# Patient Record
Sex: Male | Born: 1945 | Race: Black or African American | Hispanic: No | Marital: Married | Smoking: Former smoker
Health system: Southern US, Community
[De-identification: ages and names within clinical notes are randomized; demographics above are authoritative.]

## PROBLEM LIST (undated history)

## (undated) DIAGNOSIS — R079 Chest pain, unspecified: Secondary | ICD-10-CM

## (undated) DIAGNOSIS — M51369 Other intervertebral disc degeneration, lumbar region without mention of lumbar back pain or lower extremity pain: Secondary | ICD-10-CM

## (undated) DIAGNOSIS — J309 Allergic rhinitis, unspecified: Secondary | ICD-10-CM

## (undated) DIAGNOSIS — D696 Thrombocytopenia, unspecified: Secondary | ICD-10-CM

## (undated) DIAGNOSIS — R768 Other specified abnormal immunological findings in serum: Secondary | ICD-10-CM

## (undated) DIAGNOSIS — D72819 Decreased white blood cell count, unspecified: Secondary | ICD-10-CM

## (undated) DIAGNOSIS — H9313 Tinnitus, bilateral: Secondary | ICD-10-CM

## (undated) DIAGNOSIS — Z889 Allergy status to unspecified drugs, medicaments and biological substances status: Secondary | ICD-10-CM

## (undated) DIAGNOSIS — B1910 Unspecified viral hepatitis B without hepatic coma: Secondary | ICD-10-CM

## (undated) DIAGNOSIS — G43909 Migraine, unspecified, not intractable, without status migrainosus: Secondary | ICD-10-CM

## (undated) DIAGNOSIS — R42 Dizziness and giddiness: Secondary | ICD-10-CM

## (undated) DIAGNOSIS — E7800 Pure hypercholesterolemia, unspecified: Secondary | ICD-10-CM

## (undated) DIAGNOSIS — H8110 Benign paroxysmal vertigo, unspecified ear: Secondary | ICD-10-CM

## (undated) DIAGNOSIS — R7303 Prediabetes: Secondary | ICD-10-CM

## (undated) DIAGNOSIS — N400 Enlarged prostate without lower urinary tract symptoms: Secondary | ICD-10-CM

## (undated) DIAGNOSIS — I10 Essential (primary) hypertension: Secondary | ICD-10-CM

## (undated) DIAGNOSIS — N529 Male erectile dysfunction, unspecified: Secondary | ICD-10-CM

## (undated) DIAGNOSIS — M1990 Unspecified osteoarthritis, unspecified site: Secondary | ICD-10-CM

## (undated) HISTORY — DX: Prediabetes: R73.03

## (undated) HISTORY — DX: Chest pain, unspecified: R07.9

## (undated) HISTORY — DX: Pure hypercholesterolemia, unspecified: E78.00

## (undated) HISTORY — DX: Essential (primary) hypertension: I10

## (undated) HISTORY — DX: Decreased white blood cell count, unspecified: D72.819

## (undated) HISTORY — DX: Tinnitus, bilateral: H93.13

## (undated) HISTORY — DX: Male erectile dysfunction, unspecified: N52.9

## (undated) HISTORY — DX: Thrombocytopenia, unspecified: D69.6

## (undated) HISTORY — DX: Migraine, unspecified, not intractable, without status migrainosus: G43.909

## (undated) HISTORY — DX: Benign paroxysmal vertigo, unspecified ear: H81.10

## (undated) HISTORY — DX: Allergic rhinitis, unspecified: J30.9

## (undated) HISTORY — DX: Unspecified viral hepatitis B without hepatic coma: B19.10

## (undated) HISTORY — DX: Benign prostatic hyperplasia without lower urinary tract symptoms: N40.0

## (undated) HISTORY — DX: Other intervertebral disc degeneration, lumbar region without mention of lumbar back pain or lower extremity pain: M51.369

---

## 1999-12-20 ENCOUNTER — Encounter: Payer: Self-pay | Admitting: Internal Medicine

## 1999-12-20 ENCOUNTER — Encounter: Admission: RE | Admit: 1999-12-20 | Discharge: 1999-12-20 | Payer: Self-pay | Admitting: Internal Medicine

## 2011-05-01 ENCOUNTER — Emergency Department (HOSPITAL_COMMUNITY): Payer: 59

## 2011-05-01 ENCOUNTER — Emergency Department (HOSPITAL_COMMUNITY)
Admission: EM | Admit: 2011-05-01 | Discharge: 2011-05-01 | Disposition: A | Discharge status: Home / Self Care | Payer: 59 | Attending: Emergency Medicine | Admitting: Emergency Medicine

## 2011-05-01 DIAGNOSIS — R079 Chest pain, unspecified: Secondary | ICD-10-CM | POA: Insufficient documentation

## 2011-05-01 DIAGNOSIS — Z87891 Personal history of nicotine dependence: Secondary | ICD-10-CM | POA: Insufficient documentation

## 2011-05-01 DIAGNOSIS — Z79899 Other long term (current) drug therapy: Secondary | ICD-10-CM | POA: Insufficient documentation

## 2011-05-01 LAB — CK TOTAL AND CKMB (NOT AT ARMC)
CK, MB: 3.3 ng/mL (ref 0.3–4.0)
CK, MB: 3.1 ng/mL (ref 0.3–4.0)
Relative Index: 1.6 (ref 0.0–2.5)
Relative Index: 1.5 (ref 0.0–2.5)
Total CK: 204 U/L (ref 7–232)
Total CK: 202 U/L (ref 7–232)

## 2011-05-01 LAB — CBC
HCT: 42.8 % (ref 39.0–52.0)
Hemoglobin: 15.1 g/dL (ref 13.0–17.0)
MCH: 30.4 pg (ref 26.0–34.0)
MCHC: 35.3 g/dL (ref 30.0–36.0)
MCV: 86.3 fL (ref 78.0–100.0)
Platelets: 145 10*3/uL — ABNORMAL LOW (ref 150–400)
RBC: 4.96 MIL/uL (ref 4.22–5.81)
RDW: 14.6 % (ref 11.5–15.5)
WBC: 4.9 10*3/uL (ref 4.0–10.5)

## 2011-05-01 LAB — DIFFERENTIAL
Basophils Absolute: 0 10*3/uL (ref 0.0–0.1)
Basophils Relative: 0 % (ref 0–1)
Eosinophils Absolute: 0 10*3/uL (ref 0.0–0.7)
Eosinophils Relative: 1 % (ref 0–5)
Lymphocytes Relative: 26 % (ref 12–46)
Lymphs Abs: 1.3 10*3/uL (ref 0.7–4.0)
Monocytes Absolute: 0.2 10*3/uL (ref 0.1–1.0)
Monocytes Relative: 3 % (ref 3–12)
Neutro Abs: 3.5 10*3/uL (ref 1.7–7.7)
Neutrophils Relative %: 70 % (ref 43–77)

## 2011-05-01 LAB — BASIC METABOLIC PANEL
CO2: 25 mEq/L (ref 19–32)
Calcium: 9.1 mg/dL (ref 8.4–10.5)
Chloride: 104 mEq/L (ref 96–112)
Glucose, Bld: 112 mg/dL — ABNORMAL HIGH (ref 70–99)
Potassium: 4 mEq/L (ref 3.5–5.1)
Sodium: 138 mEq/L (ref 135–145)

## 2011-05-01 LAB — TROPONIN I: Troponin I: <0.3 ng/mL (ref <0.30)

## 2014-08-16 ENCOUNTER — Emergency Department (HOSPITAL_COMMUNITY)
Admission: EM | Admit: 2014-08-16 | Discharge: 2014-08-16 | Disposition: A | Discharge status: Home / Self Care | Payer: BC Managed Care – PPO | Attending: Emergency Medicine | Admitting: Emergency Medicine

## 2014-08-16 ENCOUNTER — Encounter (HOSPITAL_COMMUNITY): Payer: Self-pay

## 2014-08-16 DIAGNOSIS — I10 Essential (primary) hypertension: Secondary | ICD-10-CM | POA: Diagnosis not present

## 2014-08-16 DIAGNOSIS — R42 Dizziness and giddiness: Secondary | ICD-10-CM | POA: Diagnosis present

## 2014-08-16 DIAGNOSIS — Z87891 Personal history of nicotine dependence: Secondary | ICD-10-CM | POA: Diagnosis not present

## 2014-08-16 DIAGNOSIS — H81399 Other peripheral vertigo, unspecified ear: Secondary | ICD-10-CM

## 2014-08-16 MED ORDER — MECLIZINE HCL 25 MG PO TABS
25.0000 mg | ORAL_TABLET | Freq: Two times a day (BID) | ORAL | Status: DC | PRN
  Administered 2014-08-16: 25 mg via ORAL

## 2014-08-16 MED ORDER — MECLIZINE HCL 25 MG PO TABS: 25.0000 mg | ORAL_TABLET | Freq: Two times a day (BID) | ORAL | Status: DC | PRN

## 2014-08-16 MED ORDER — MECLIZINE HCL 25 MG PO TABS
12.5000 mg | ORAL_TABLET | Freq: Two times a day (BID) | ORAL | Status: DC | PRN
  Filled 2014-08-16: qty 1

## 2014-08-16 NOTE — ED Provider Notes (Signed)
CSN: [id number]     Arrival date & time 08/16/14  0827 History   First MD Initiated Contact with Patient 08/16/14 [phone]     Chief Complaint  Patient presents with  . Dizziness    HPI [name] is a 68yo man with no significant PMHx who presents to the ED with vertigo. He states he woke up and turned to the side to turn off the alarm clock when he felt like the world was spinning. He states he had a similar sensation about 1 year ago for which he attributed his symptoms to a cold. He states he took decongestants at that time and the symptoms resolved. He states he took some decongestants this morning, which did not relieve his symptoms. He reports some nausea and pressure behind his eyes and nose, but denies vomiting, fever, chills, poor appetite, headache, difficulty walking, diplopia, chest pain, SOB, and weakness.   History reviewed. No pertinent past medical history. History reviewed. No pertinent past surgical history. History reviewed. No pertinent family history. History  Substance Use Topics  . Smoking status: Former Smoker    Quit date: 10/15/1984  . Smokeless tobacco: Not on file  . Alcohol Use: 4.2 oz/week    7 Cans of beer per week     Comment: Socially    Review of Systems General: Denies night sweats, changes in weight HEENT: Denies ear pain, rhinorrhea, sore throat CV: Denies palpitations, orthopnea Pulm: Denies cough, wheezing GI: Denies diarrhea, constipation, melena, hematochezia GU: Denies dysuria, hematuria, frequency Msk: Denies muscle cramps, joint pains Neuro: Denies numbness, tingling Skin: Denies rashes, bruising   Allergies  Review of patient's allergies indicates no known allergies.  Home Medications   Prior to Admission medications   Not on File   BP 164/135 mmHg  Pulse 66  Temp(Src) 97.6 F (36.4 C) (Oral)  Resp 14  Ht 5\' 9"  (1.753 m)  Wt 196 lb (88.905 kg)  BMI 28.93 kg/m2  SpO2 97% Physical Exam General: alert, sitting up in bed,  NAD HEENT: [place]/AT, EOMI, PERRL, sclera anicteric, mucus membranes moist Neck: supple, no JVD, no lymphadenopathy CV: RRR, normal S1/S2, no m/g/r Pulm: CTA bilaterally, breaths non-labored Abd: BS+, soft, non-distended, non-tender Ext: warm, no edema, moves all Neuro: alert and oriented x 3, facial symmetry, CNs II-XII intact, strength 5/5 in upper and lower extremities bilaterally, finger to nose intact, gait normal  ED Course  Procedures (including critical care time) Labs Review Labs Reviewed - No data to display  Imaging Review No results found.   EKG Interpretation   Date/Time:  Monday August 16 2014 09:33:29 EST Ventricular Rate:  65 PR Interval:  151 QRS Duration: 101 QT Interval:  414 QTC Calculation: 430 R Axis:   -49 Text Interpretation:  Sinus rhythm Left anterior fascicular block Low  voltage, precordial leads Probable left ventricular hypertrophy  Nonspecific T abnormalities, inferior leads changes noted when compared to  prior Confirmed by Christy Gentles  MD, Hart[place] ([id number]) on 08/16/2014 9:42:33 AM      MDM   Final diagnoses:  None    [name] is a 68yo man w/ no significant PMHx who presented with vertigo likely 2/2 peripheral causes, likely benign paroxysmal positional vertigo. Patient did not have any focal deficits on neurological exam (no weakness, finger to nose intact, gait normal) making posterior stroke unlikely. Patient also noted to have elevated BP of 170/115. He likely has hypertension that is undiagnosed. Patient was given Meclizine 25 mg in the ED and reported  that his symptoms improved. He was prescribed Meclizine 25 mg BID PRN dizziness/nausea upon discharge. He was instructed to come back to the ED if he develops sudden onset headache, diplopia, or sudden weakness. He was recommended to follow up with his PCP in 1 week to assess his vertigo as well as his elevated BP.     Albin Felling, MD 08/16/14 1029

## 2014-08-16 NOTE — ED Provider Notes (Signed)
Patient seen/examined in the Emergency Department in conjunction with Resident Physician Provider Rivet Patient reports vertigo, worse with position change Exam : awake/alert, no facial droop, no ptosis, no motor weakness, no past pointing, no ataxia, pt is smiling and well appearing Plan: will give antivert and reassess.  I have low suspicion for occult CVA at this time   Sharyon Cable, MD 08/16/14 [phone]

## 2014-08-16 NOTE — Discharge Instructions (Signed)
Please follow up with your primary care doctor in 1 week. You were in the emergency department for peripheral vertigo and elevated blood pressure.   Take Meclizine (Antivert) 25 mg twice a day as needed for dizziness/nausea. If you develop severe headache, double vision, or sudden weakness please come back to the emergency department.

## 2014-08-16 NOTE — ED Notes (Signed)
Pt reports he woke up this morning and when he stood up he felt like the room was spinning. Pt denies falling. Pt denies headache but reports some pressure behind his eyes. A&O x4.

## 2014-12-15 ENCOUNTER — Other Ambulatory Visit: Payer: Self-pay | Admitting: Internal Medicine

## 2014-12-15 DIAGNOSIS — E780 Pure hypercholesterolemia: Secondary | ICD-10-CM | POA: Diagnosis not present

## 2014-12-15 DIAGNOSIS — N529 Male erectile dysfunction, unspecified: Secondary | ICD-10-CM | POA: Diagnosis not present

## 2014-12-15 DIAGNOSIS — Z0000 Encounter for general adult medical examination without abnormal findings: Secondary | ICD-10-CM | POA: Diagnosis not present

## 2014-12-15 DIAGNOSIS — Z125 Encounter for screening for malignant neoplasm of prostate: Secondary | ICD-10-CM | POA: Diagnosis not present

## 2014-12-15 DIAGNOSIS — Z139 Encounter for screening, unspecified: Secondary | ICD-10-CM

## 2014-12-22 ENCOUNTER — Ambulatory Visit
Admission: RE | Admit: 2014-12-22 | Discharge: 2014-12-22 | Disposition: A | Discharge status: Home / Self Care | Payer: Medicare Other | Source: Ambulatory Visit | Attending: Internal Medicine | Admitting: Internal Medicine

## 2014-12-22 DIAGNOSIS — Z139 Encounter for screening, unspecified: Secondary | ICD-10-CM

## 2014-12-22 DIAGNOSIS — Z136 Encounter for screening for cardiovascular disorders: Secondary | ICD-10-CM | POA: Diagnosis not present

## 2014-12-22 DIAGNOSIS — Z87891 Personal history of nicotine dependence: Secondary | ICD-10-CM | POA: Diagnosis not present

## 2015-01-24 DIAGNOSIS — H2513 Age-related nuclear cataract, bilateral: Secondary | ICD-10-CM | POA: Diagnosis not present

## 2015-01-24 DIAGNOSIS — H43812 Vitreous degeneration, left eye: Secondary | ICD-10-CM | POA: Diagnosis not present

## 2015-06-17 ENCOUNTER — Encounter (HOSPITAL_COMMUNITY): Payer: Self-pay | Admitting: *Deleted

## 2015-06-17 ENCOUNTER — Emergency Department (HOSPITAL_COMMUNITY)
Admission: EM | Admit: 2015-06-17 | Discharge: 2015-06-17 | Disposition: A | Discharge status: Home / Self Care | Payer: Worker's Compensation | Attending: Emergency Medicine | Admitting: Emergency Medicine

## 2015-06-17 DIAGNOSIS — Z87891 Personal history of nicotine dependence: Secondary | ICD-10-CM | POA: Insufficient documentation

## 2015-06-17 DIAGNOSIS — Y998 Other external cause status: Secondary | ICD-10-CM | POA: Diagnosis not present

## 2015-06-17 DIAGNOSIS — Y9389 Activity, other specified: Secondary | ICD-10-CM | POA: Insufficient documentation

## 2015-06-17 DIAGNOSIS — S60451A Superficial foreign body of left index finger, initial encounter: Secondary | ICD-10-CM | POA: Diagnosis not present

## 2015-06-17 DIAGNOSIS — S6991XA Unspecified injury of right wrist, hand and finger(s), initial encounter: Secondary | ICD-10-CM | POA: Diagnosis present

## 2015-06-17 DIAGNOSIS — Y9289 Other specified places as the place of occurrence of the external cause: Secondary | ICD-10-CM | POA: Diagnosis not present

## 2015-06-17 DIAGNOSIS — W228XXA Striking against or struck by other objects, initial encounter: Secondary | ICD-10-CM | POA: Insufficient documentation

## 2015-06-17 DIAGNOSIS — Z189 Retained foreign body fragments, unspecified material: Secondary | ICD-10-CM

## 2015-06-17 MED ORDER — LIDOCAINE HCL (PF) 1 % IJ SOLN
10.0000 mL | Freq: Once | INTRAMUSCULAR | Status: DC
  Filled 2015-06-17: qty 10

## 2015-06-17 NOTE — ED Notes (Signed)
Hand surgeon at bedside.  Lidocaine provided to MD.  Suture cart and supplies provided at bedside

## 2015-06-17 NOTE — ED Provider Notes (Signed)
CSN: [id number]     Arrival date & time 06/17/15  1536 History  This chart was scribed for non-physician practitioner, Carlisle Cater, PA-C working with Milton Ferguson, MD by Tula Nakayama, ED scribe. This patient was seen in room TR02C/TR02C and the patient's care was started at 4:17 PM    Chief Complaint  Patient presents with  . Hand Injury   The history is provided by the patient. No language interpreter was used.   HPI Comments: Charles Reed is a 69 y.o. male who presents to the Emergency Department complaining of constant, moderate left hand pain secondary to a foreign body in his index finger that started 2 days ago. He states swelling of his left hand as an associated symptom. His pain becomes worse with extending his fingers. 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 He was evaluated by urgent care after the injury, had x-rays of the affected area and was referred to a specialist for removal. Pt was prescribed Keflex and Vicodin, but has not taken the pain medication because of its potency. His Tetanus is UTD. Pt denies fever, nausea and vomiting as associated symptoms.   History reviewed. No pertinent past medical history. History reviewed. No pertinent past surgical history. History reviewed. No pertinent family history. Social History  Substance Use Topics  . Smoking status: Former Smoker    Quit date: 10/15/1984  . Smokeless tobacco: None  . Alcohol Use: 4.2 oz/week    7 Cans of beer per week     Comment: Socially    Review of Systems  Constitutional: Negative for fever, chills and activity change.  Gastrointestinal: Negative for vomiting.  Musculoskeletal: Positive for joint swelling and arthralgias. Negative for back pain, gait problem and neck pain.  Skin: Positive for wound.  Neurological: Negative for weakness and numbness.    Allergies  Review of patient's allergies indicates no known allergies.  Home  Medications   Prior to Admission medications   Medication Sig Start Date End Date Taking? Authorizing Provider  ibuprofen (ADVIL,MOTRIN) 200 MG tablet Take 400 mg by mouth every 6 (six) hours as needed (for pain).    Historical Provider, MD  meclizine (ANTIVERT) 25 MG tablet Take 1 tablet (25 mg total) by mouth 2 (two) times daily as needed for dizziness. 08/16/14   Carly Montey Hora, MD  pseudoephedrine-acetaminophen (TYLENOL SINUS) 30-500 MG TABS Take 2 tablets by mouth every 4 (four) hours as needed (for congestion).    Historical Provider, MD   BP 148/96 mmHg  Pulse 76  Temp(Src) 97.8 F (36.6 C) (Oral)  Resp 16  SpO2 100%   Physical Exam  Constitutional: He appears well-developed and well-nourished.  HENT:  Head: Normocephalic and atraumatic.  Eyes: Conjunctivae are normal.  Neck: Normal range of motion. Neck supple.  Pulmonary/Chest: No respiratory distress.  Neurological: He is alert.  Skin: Skin is warm and dry.  There is approximately 3 cm area of erythema, swelling, and warmth between the second and third MCP joints of the dorsum of the left hand. No fluctuance. 0/4 [place] signs present. Patient can flex and extend at the MCP with minimal discomfort.  Psychiatric: He has a normal mood and affect.  Nursing note and vitals reviewed.   ED Course  Procedures   DIAGNOSTIC STUDIES: Oxygen Saturation is 100% on RA, normal by my interpretation.    COORDINATION OF CARE: 4:26 PM Discussed non-emergent nature of injury and hesitance to remove  metal piece in the ED. Will review x-rays on the CD. Pt agreed to plan.   Labs Review Labs Reviewed - No data to display  Imaging Review No results found.    EKG Interpretation None       Vital signs reviewed and are as follows: Filed Vitals:   06/17/15 1554  BP: 148/96  Pulse: 76  Temp: 97.8 F (36.6 C)  Resp: 16   5:21 PM Patient seen with Dr. Roderic Palau. I have spoken with Dr. Amedeo Plenty who will see patient in ED to advise on  plan.   7:07 PM Dr. Amedeo Plenty evaluated pt. To perform I&D when possible.   After I&D completed, patient was discharged to home. Instructions per Dr. Amedeo Plenty.    MDM   Final diagnoses:  Retained foreign body   Patient with FB, removed in ED by Dr. Amedeo Plenty.   I personally performed the services described in this documentation, which was scribed in my presence. The recorded information has been reviewed and is accurate.    Carlisle Cater, PA-C 06/17/15 2352  Milton Ferguson, MD 06/18/15 [phone]

## 2015-06-17 NOTE — Consult Note (Signed)
Reason for Consult: Left dorsal hand foreign body with pain after a puncture injury Referring Physician: ER staff  Charles Reed is an 69 y.o. male.  HPI: Patient is a pleasant 69 year old male status post puncture injury with retained foreign body pain and discomfort. He's had marked swelling and a problem since the injury. Eyes other issue or injury  History reviewed. No pertinent past medical history.  History reviewed. No pertinent past surgical history.  History reviewed. No pertinent family history.  Social History:  reports that he quit smoking about 30 years ago. He does not have any smokeless tobacco history on file. He reports that he drinks about 4.2 oz of alcohol per week. He reports that he does not use illicit drugs.  Allergies: No Known Allergies  Medications: I have reviewed the patient's current medications.  No results found for this or any previous visit (from the past 48 hour(s)).  No results found.  Review of Systems  Constitutional: Negative.   HENT: Negative.   Eyes: Negative.   Respiratory: Negative.   Cardiovascular: Negative.   Gastrointestinal: Negative.   Genitourinary: Negative.   Skin: Negative.   Psychiatric/Behavioral: Negative.    Blood pressure 148/96, pulse 76, temperature 97.8 F (36.6 C), temperature source Oral, resp. rate 16, SpO2 100 %. Physical Exam retained foreign body with pain left hand about the index finger right hand is normal FDP FDS and extensor function appear to be intact he has exquisite pain and difficulty.  X-rays confirm foreign body.  The patient is alert and oriented in no acute distress. The patient complains of pain in the affected upper extremity.  The patient is noted to have a normal HEENT exam. Lung fields show equal chest expansion and no shortness of breath. Abdomen exam is nontender without distention. Lower extremity examination does not show any fracture dislocation or blood clot symptoms. Pelvis is  stable and the neck and back are stable and nontender.  Assessment/Plan: We are planning surgery for your upper extremity. The risk and benefits of surgery to include risk of bleeding, infection, anesthesia,  damage to normal structures and failure of the surgery to accomplish its intended goals of relieving symptoms and restoring function have been discussed in detail. With this in mind we plan to proceed. I have specifically discussed with the patient the pre-and postoperative regime and the dos and don'ts and risk and benefits in great detail. Risk and benefits of surgery also include risk of dystrophy(CRPS), chronic nerve pain, failure of the healing process to go onto completion and other inherent risks of surgery The relavent the pathophysiology of the disease/injury process, as well as the alternatives for treatment and postoperative course of action has been discussed in great detail with the patient who desires to proceed.  We will do everything in our power to help you (the patient) restore function to the upper extremity. It is a pleasure to see this patient today.  Foreign body noted on x-ray painful left hand  I've can send the patient Friday and repair.  Procedure note patient was taken to the procedure suite underwent a field block followed by sterile prep and drape Betadine scrub and paint followed by incision being made. Incision was made assistance carried down. Irrigation debridement excisional nature skin subtends tissue and peritendinous tissue was accomplished. Following this a, KD perform body was removed in the form of a metallic fragment. This was given to the patient. There was irrigated. The tendon was intact. We closed the length  and placed a sterile dressing. Patient tolerated the procedure well.  He'll continue his Keflex. He will use pain medicine as needed. I'll see him back in the office 9:30 Tuesday morning keep the area clean and dry. These notes been discussed. Keep  bandage clean and dry.  Call for any problems.  No smoking.  Criteria for driving a car: you should be off your pain medicine for 7-8 hours, able to drive one handed(confident), thinking clearly and feeling able in your judgement to drive. Continue elevation as it will decrease swelling.  If instructed by MD move your fingers within the confines of the bandage/splint.  Use ice if instructed by your MD. Call immediately for any sudden loss of feeling in your hand/arm or change in functional abilities of the extremity.We recommend that you to take vitamin C 1000 mg a day to promote healing. We also recommend that if you require  pain medicine that you take a stool softener to prevent constipation as most pain medicines will have constipation side effects. We recommend either Peri-Colace or Senokot and recommend that you also consider adding MiraLAX to prevent the constipation affects from pain medicine if you are required to use them. These medicines are over the counter and maybe purchased at a local pharmacy. A cup of yogurt and a probiotic can also be helpful during the recovery process as the medicines can disrupt your intestinal environment.  Paulene Floor 06/17/2015, 9:48 PM

## 2015-06-17 NOTE — ED Notes (Signed)
Pt stable, ambulatory, states understanding of discharge instructions 

## 2015-06-17 NOTE — ED Notes (Signed)
Pt reports injury to left hand at work on wednesday, reports a piece of metal is in his hand. No acute distress noted.

## 2015-06-17 NOTE — ED Notes (Signed)
Hand surgeon at bedside at this time

## 2015-06-17 NOTE — Discharge Instructions (Signed)
Please read and follow all provided instructions.  Your diagnoses today include:  1. Retained foreign body    Tests performed today include:  Vital signs. See below for your results today.   Medications prescribed:   None  Take any prescribed medications only as directed.   Home care instructions:  Follow any educational materials contained in this packet. Keep affected area above the level of your heart when possible. Wash area gently twice a day with warm soapy water. Do not apply alcohol or hydrogen peroxide. Cover the area if it draining or weeping.   Follow-up instructions: Follow-up with Dr. Amedeo Plenty as discussed.   Return instructions:  Return to the Emergency Department if you have:  Fever  Worsening symptoms  Worsening pain  Worsening swelling  Redness of the skin that moves away from the affected area, especially if it streaks away from the affected area   Any other emergent concerns  Your vital signs today were: BP 148/96 mmHg   Pulse 76   Temp(Src) 97.8 F (36.6 C) (Oral)   Resp 16   SpO2 100% If your blood pressure (BP) was elevated above 135/85 this visit, please have this repeated by your doctor within one month. --------------

## 2015-07-03 ENCOUNTER — Encounter (HOSPITAL_COMMUNITY): Payer: Self-pay | Admitting: Emergency Medicine

## 2015-07-03 ENCOUNTER — Emergency Department (HOSPITAL_COMMUNITY)
Admission: EM | Admit: 2015-07-03 | Discharge: 2015-07-04 | Disposition: A | Discharge status: Home / Self Care | Payer: Medicare Other | Attending: Emergency Medicine | Admitting: Emergency Medicine

## 2015-07-03 DIAGNOSIS — M533 Sacrococcygeal disorders, not elsewhere classified: Secondary | ICD-10-CM | POA: Diagnosis not present

## 2015-07-03 DIAGNOSIS — Z87891 Personal history of nicotine dependence: Secondary | ICD-10-CM | POA: Diagnosis not present

## 2015-07-03 DIAGNOSIS — M899 Disorder of bone, unspecified: Secondary | ICD-10-CM | POA: Diagnosis not present

## 2015-07-03 DIAGNOSIS — M545 Low back pain, unspecified: Secondary | ICD-10-CM

## 2015-07-03 DIAGNOSIS — M898X9 Other specified disorders of bone, unspecified site: Secondary | ICD-10-CM

## 2015-07-03 NOTE — ED Notes (Signed)
Pt states that he has had worsening R hip pain over the past week. Denies injury. Alert and oriented.

## 2015-07-04 ENCOUNTER — Emergency Department (HOSPITAL_COMMUNITY): Payer: Medicare Other

## 2015-07-04 DIAGNOSIS — M5416 Radiculopathy, lumbar region: Secondary | ICD-10-CM | POA: Diagnosis not present

## 2015-07-04 DIAGNOSIS — M533 Sacrococcygeal disorders, not elsewhere classified: Secondary | ICD-10-CM | POA: Diagnosis not present

## 2015-07-04 DIAGNOSIS — M545 Low back pain: Secondary | ICD-10-CM | POA: Diagnosis not present

## 2015-07-04 MED ORDER — HYDROCODONE-ACETAMINOPHEN 5-325 MG PO TABS
1.0000 | ORAL_TABLET | Freq: Once | ORAL | Status: AC
  Administered 2015-07-04: 1 via ORAL
  Filled 2015-07-04: qty 1

## 2015-07-04 MED ORDER — HYDROCODONE-ACETAMINOPHEN 5-325 MG PO TABS: 1.0000 | ORAL_TABLET | Freq: Four times a day (QID) | ORAL | Status: DC | PRN

## 2015-07-04 NOTE — ED Provider Notes (Addendum)
CSN: [id number]   Arrival date & time 07/03/15 2321  History  This chart was scribed for Charles Rosser, MD by Altamease Oiler, ED Scribe. This patient was seen in room WA22/WA22 and the patient's care was started at 12:15 AM.  Chief Complaint  Patient presents with  . Back Pain    HPI The history is provided by the patient. No language interpreter was used.   AMIIR Reed is a 69 y.o. male who presents to the Emergency Department complaining of new and atraumatic pain in the right SI joint with onset 1 week ago. The constant pain is described as sharp and it has been worse over the last 3 days. The pain intermittently radiates down the back of the right leg but he has no pain radiation at present. He rates the pain 7/10 in severity at rest and 10/10 in severity with movement. Associated symptoms include intermittent numbness and weakness in the right leg, no at the present time. Pt denies change in bowel or bladder function and abdominal pain.   History reviewed. No pertinent past medical history.  History reviewed. No pertinent past surgical history.  History reviewed. No pertinent family history.  Social History  Substance Use Topics  . Smoking status: Former Smoker    Quit date: 10/15/1984  . Smokeless tobacco: None  . Alcohol Use: 4.2 oz/week    7 Cans of beer per week     Comment: Socially     Review of Systems 10 Systems reviewed and all are negative for acute change except as noted in the HPI. Home Medications   Prior to Admission medications   Medication Sig Start Date End Date Taking? Authorizing Provider  ibuprofen (ADVIL,MOTRIN) 200 MG tablet Take 400 mg by mouth every 6 (six) hours as needed (for pain).    Historical Provider, MD    Allergies  Review of patient's allergies indicates no known allergies.  Triage Vitals: BP 157/93 mmHg  Pulse 72  Temp(Src) 97.9 F (36.6 C) (Oral)  SpO2 100%  Physical Exam General: Well-developed, well-nourished male in no acute  distress; appearance consistent with age of record HENT: normocephalic; atraumatic Eyes: pupils equal, round and reactive to light; extraocular muscles intact, arcus senilis bilaterally Neck: supple Heart: regular rate and rhythm Lungs: clear to auscultation bilaterally Abdomen: soft; nondistended; nontender; no masses or hepatosplenomegaly; bowel sounds present Back: No tenderness on the back but pain on movement of the lower back; negative SLR on left, +SLR on the right at about 45 degrees Extremities: No deformity; full range of motion; pulses normal Neurologic: Awake, alert and oriented; motor function intact in all extremities and symmetric; no facial droop Skin: Warm and dry Psychiatric: Normal mood and affect  ED Course  Procedures   DIAGNOSTIC STUDIES: Oxygen Saturation is 100% on RA, normal by my interpretation.    COORDINATION OF CARE: 12:21 AM Discussed treatment plan which includes XR of the pelvis and pain management with pt at bedside and pt agreed to plan.   MDM  Nursing notes and vitals signs, including pulse oximetry, reviewed.  Summary of this visit's results, reviewed by myself:  Imaging Studies: Dg Pelvis 1-2 Views  07/04/2015   CLINICAL DATA:  New nontraumatic pain in the right SI joint, onset 1 week ago. Worse over 3 days. Radiates to the right leg.  EXAM: PELVIS - 1-2 VIEW  COMPARISON:  None.  FINDINGS: Degenerative changes in the lower lumbar spine and hips. Pelvis appears intact. No evidence of acute fracture or  dislocation. No focal bone lesion or bone destruction. SI joints are symmetrical and symphysis pubis is not displaced. There appears to be a prominent exostosis or osteophyte arising from the right lower sacrum.  IMPRESSION: No acute fracture or dislocation. Prominent osteophyte or exostosis arises from the right lower sacrum. Degenerative changes.   Electronically Signed   By: Lucienne Capers M.D.   On: 07/04/2015 00:51    I personally performed  the services described in this documentation, which was scribed in my presence. The recorded information has been reviewed and is accurate.    Charles Rosser, MD 07/04/15 5974  Charles Rosser, MD 07/04/15 [id number]

## 2015-07-06 ENCOUNTER — Other Ambulatory Visit: Payer: Self-pay | Admitting: Neurological Surgery

## 2015-07-06 DIAGNOSIS — M5416 Radiculopathy, lumbar region: Secondary | ICD-10-CM

## 2015-07-07 ENCOUNTER — Ambulatory Visit
Admission: RE | Admit: 2015-07-07 | Discharge: 2015-07-07 | Disposition: A | Discharge status: Home / Self Care | Payer: Medicare Other | Source: Ambulatory Visit | Attending: Neurological Surgery | Admitting: Neurological Surgery

## 2015-07-07 ENCOUNTER — Other Ambulatory Visit: Payer: Self-pay | Admitting: Neurological Surgery

## 2015-07-07 DIAGNOSIS — T1590XA Foreign body on external eye, part unspecified, unspecified eye, initial encounter: Secondary | ICD-10-CM

## 2015-07-07 DIAGNOSIS — M5416 Radiculopathy, lumbar region: Secondary | ICD-10-CM

## 2015-07-11 ENCOUNTER — Ambulatory Visit
Admission: RE | Admit: 2015-07-11 | Discharge: 2015-07-11 | Disposition: A | Discharge status: Home / Self Care | Payer: Medicare Other | Source: Ambulatory Visit | Attending: Neurological Surgery | Admitting: Neurological Surgery

## 2015-07-11 DIAGNOSIS — M5116 Intervertebral disc disorders with radiculopathy, lumbar region: Secondary | ICD-10-CM | POA: Diagnosis not present

## 2015-07-11 DIAGNOSIS — T1590XA Foreign body on external eye, part unspecified, unspecified eye, initial encounter: Secondary | ICD-10-CM

## 2015-07-11 DIAGNOSIS — M4806 Spinal stenosis, lumbar region: Secondary | ICD-10-CM | POA: Diagnosis not present

## 2015-07-11 DIAGNOSIS — Z01818 Encounter for other preprocedural examination: Secondary | ICD-10-CM | POA: Diagnosis not present

## 2015-07-20 DIAGNOSIS — R030 Elevated blood-pressure reading, without diagnosis of hypertension: Secondary | ICD-10-CM | POA: Diagnosis not present

## 2015-07-20 DIAGNOSIS — M4806 Spinal stenosis, lumbar region: Secondary | ICD-10-CM | POA: Diagnosis not present

## 2015-07-20 DIAGNOSIS — Z6829 Body mass index (BMI) 29.0-29.9, adult: Secondary | ICD-10-CM | POA: Diagnosis not present

## 2015-07-22 ENCOUNTER — Other Ambulatory Visit (HOSPITAL_COMMUNITY): Payer: Self-pay | Admitting: Neurological Surgery

## 2015-08-01 NOTE — Pre-Procedure Instructions (Signed)
Charles Reed  08/01/2015      [id number] - [place], [place] - [place] [address] [place] [address] Phone: [phone] Fax: [phone]    Your procedure is scheduled on Fri, Oct 21 @ 7:30 AM  Report to [facility] Admitting at 5:30 AM  Call this number if you have problems the morning of surgery:  [phone]   Remember:  Do not eat food or drink liquids after midnight.               Stop taking your Ibuprofen and Fish Oil. No Goody's,BC's,Aleve,Aspirin,or any Herbal Medications.    Do not wear jewelry, make-up or nail polish.  Do not wear lotions, powders, or perfumes.  You may wear deodorant.  You may shave your face and neck.  Do not bring valuables to the hospital.  [facility] is not responsible for any belongings or valuables.  Contacts, dentures or bridgework may not be worn into surgery.  Leave your suitcase in the car.  After surgery it may be brought to your room.  For patients admitted to the hospital, discharge time will be determined by your treatment team.  Patients discharged the day of surgery will not be allowed to drive home.    Special instructions:  [facility] - Preparing for Surgery  Before surgery, you can play an important role.  Because skin is not sterile, your skin needs to be as free of germs as possible.  You can reduce the number of germs on you skin by washing with CHG (chlorahexidine gluconate) soap before surgery.  CHG is an antiseptic cleaner which kills germs and bonds with the skin to continue killing germs even after washing.  Please DO NOT use if you have an allergy to CHG or antibacterial soaps.  If your skin becomes reddened/irritated stop using the CHG and inform your nurse when you arrive at Short Stay.  Do not shave (including legs and underarms) for at least 48 hours prior to the first CHG shower.  You may shave your face.  Please follow  these instructions carefully:   1.  Shower with CHG Soap the night before surgery and the                                morning of Surgery.  2.  If you choose to wash your hair, wash your hair first as usual with your       normal shampoo.  3.  After you shampoo, rinse your hair and body thoroughly to remove the                      Shampoo.  4.  Use CHG as you would any other liquid soap.  You can apply chg directly       to the skin and wash gently with scrungie or a clean washcloth.  5.  Apply the CHG Soap to your body ONLY FROM THE NECK DOWN.        Do not use on open wounds or open sores.  Avoid contact with your eyes,       ears, mouth and genitals (private parts).  Wash genitals (private parts)       with your normal soap.  6.  Wash thoroughly, paying special attention to the area where your surgery  will be performed.  7.  Thoroughly rinse your body with warm water from the neck down.  8.  DO NOT shower/wash with your normal soap after using and rinsing off       the CHG Soap.  9.  Pat yourself dry with a clean towel.            10.  Wear clean pajamas.            11.  Place clean sheets on your bed the night of your first shower and do not        sleep with pets.  Day of Surgery  Do not apply any lotions/deoderants the morning of surgery.  Please wear clean clothes to the hospital/surgery center.    Please read over the following fact sheets that you were given. Pain Booklet, Coughing and Deep Breathing, MRSA Information and Surgical Site Infection Prevention

## 2015-08-02 ENCOUNTER — Encounter (HOSPITAL_COMMUNITY): Payer: Self-pay

## 2015-08-02 ENCOUNTER — Encounter (HOSPITAL_COMMUNITY)
Admission: RE | Admit: 2015-08-02 | Discharge: 2015-08-02 | Disposition: A | Discharge status: Home / Self Care | Payer: Medicare Other | Source: Ambulatory Visit | Attending: Neurological Surgery | Admitting: Neurological Surgery

## 2015-08-02 DIAGNOSIS — M4806 Spinal stenosis, lumbar region: Secondary | ICD-10-CM

## 2015-08-02 DIAGNOSIS — Z01818 Encounter for other preprocedural examination: Secondary | ICD-10-CM | POA: Insufficient documentation

## 2015-08-02 DIAGNOSIS — Z87891 Personal history of nicotine dependence: Secondary | ICD-10-CM

## 2015-08-02 DIAGNOSIS — Z01812 Encounter for preprocedural laboratory examination: Secondary | ICD-10-CM

## 2015-08-02 DIAGNOSIS — D696 Thrombocytopenia, unspecified: Secondary | ICD-10-CM

## 2015-08-02 HISTORY — DX: Unspecified osteoarthritis, unspecified site: M19.90

## 2015-08-02 HISTORY — DX: Other specified abnormal immunological findings in serum: R76.8

## 2015-08-02 LAB — BASIC METABOLIC PANEL
Anion gap: 6 (ref 5–15)
BUN: 10 mg/dL (ref 6–20)
CHLORIDE: 106 mmol/L (ref 101–111)
CO2: 29 mmol/L (ref 22–32)
CREATININE: 0.96 mg/dL (ref 0.61–1.24)
Calcium: 9.4 mg/dL (ref 8.9–10.3)
GFR calc non Af Amer: >60 mL/min (ref >60)
Glucose, Bld: 91 mg/dL (ref 65–99)
Potassium: 4 mmol/L (ref 3.5–5.1)
SODIUM: 141 mmol/L (ref 135–145)

## 2015-08-02 LAB — SURGICAL PCR SCREEN
MRSA, PCR: NEGATIVE
STAPHYLOCOCCUS AUREUS: NEGATIVE

## 2015-08-02 LAB — CBC
HCT: 46 % (ref 39.0–52.0)
Hemoglobin: 15.4 g/dL (ref 13.0–17.0)
MCH: 29.7 pg (ref 26.0–34.0)
MCHC: 33.5 g/dL (ref 30.0–36.0)
MCV: 88.8 fL (ref 78.0–100.0)
Platelets: 119 10*3/uL — ABNORMAL LOW (ref 150–400)
RBC: 5.18 MIL/uL (ref 4.22–5.81)
RDW: 14.1 % (ref 11.5–15.5)
WBC: 3.8 10*3/uL — ABNORMAL LOW (ref 4.0–10.5)

## 2015-08-02 NOTE — Progress Notes (Signed)
Pt. Followed by Dr. Maxwell Caul PCP, then Dr. Ruby Cola but has not met the next PCP. Pt. Denies all chest complaints & also has not ever been to cardio consult.  Seen in ED in 11/15- reports that it must have been a panic attack.

## 2015-08-03 NOTE — Progress Notes (Addendum)
Anesthesia Chart Review: Patient is a 69 year old male scheduled for L1-2, L2-3, L3-4 laminectomy and foraminotomy on 08/05/15 by Dr. Ellene Route.  History includes former smoker, arthritis, lumbar stenosis. Was told at a blood donation center that he could not donate blood due to positive hepatitis C antibody test. He did not report a history of HTN, but BP was elevated at 155/95 at PAT. HR 59 bpm.  PCP has been Dr. Nehemiah Settle and then Dr. Michail Sermon, but apparently has now been assigned another PCP at [facility] whom he has not met yet.  Nurses notation indicates it may be Dr. Deforest Hoyles with [facility].  Meds include Cleocin, Norco, ibuprofen, fish oil.  08/16/14 EKG (done during ED visit for dizziness, likely BPPV): NSR, LAFB, low voltage, precordial leads, probable LVH, T wave abnormality in inferolateral leads (ED physician felt they were non-specific). Inferolateral T wave inversion is new when compared to 05/01/11 tracing. Patient denied "all chest complaints" during his PAT RN interview.  Preoperative labs noted. BMET WNL. CBC shows a WBC of 3.8, H/H 15.4/46.0, and PLT count of 119K.  With reported history of positive Hepatitis C antibody, would have liked for LFTs to be done with PAT labs. If patient is ultimately cleared, he will need a HFP done pre-operatively if not done recently at [facility].  I will request most recent PCP records with labs.  Reviewed above including 2015 and 2012 EKGs with anesthesiologist Dr. Oletta Lamas. She recommends that patient's PCP review tracings and determine clearance status. Would defer decision for pre-operative cardiology evaluation to his PCP. Lorriane Shire at Dr. Reuel Derby office notified. She will contact [facility] and forwards EKGs for review.  George Hugh [facility] Short Stay Center/Anesthesiology Phone [phone] 08/03/2015 1:06 PM  Addendum: Signed medical clearance note received from Dr. Wenda Low based on findings of last CPE with EKG on 12/15/14.  EKG then (which is more current than the ones I reviewed) showed SR, LAFB, anterolateral ST elevation-repolarization variant, non-specific T wave abnormality. Lateral T wave abnormality had resolved and inferior T wave abnormality was less prominent when compared to 08/16/14 tracing.  Of note, Dr. 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 LFTs were WNL on 12/15/14. Since labs are > 6 months ago and he has mild thrombocytopenia, I'll still plan to check HFP on arrival.   George Hugh [facility] Short Stay Center/Anesthesiology Phone [phone] 08/04/2015 1:22 PM

## 2015-08-05 ENCOUNTER — Inpatient Hospital Stay (HOSPITAL_COMMUNITY): Payer: Medicare Other

## 2015-08-05 ENCOUNTER — Inpatient Hospital Stay (HOSPITAL_COMMUNITY): Payer: Medicare Other | Admitting: Anesthesiology

## 2015-08-05 ENCOUNTER — Inpatient Hospital Stay (HOSPITAL_COMMUNITY): Payer: Medicare Other | Admitting: Vascular Surgery

## 2015-08-05 ENCOUNTER — Inpatient Hospital Stay (HOSPITAL_COMMUNITY)
Admission: RE | Admit: 2015-08-05 | Discharge: 2015-08-08 | DRG: 517 | Disposition: A | Discharge status: Home / Self Care | Payer: Medicare Other | Source: Ambulatory Visit | Attending: Neurological Surgery | Admitting: Neurological Surgery

## 2015-08-05 ENCOUNTER — Encounter (HOSPITAL_COMMUNITY)
Admission: RE | Disposition: A | Discharge status: Home / Self Care | Payer: Self-pay | Source: Ambulatory Visit | Attending: Neurological Surgery

## 2015-08-05 ENCOUNTER — Encounter (HOSPITAL_COMMUNITY): Payer: Self-pay | Admitting: *Deleted

## 2015-08-05 DIAGNOSIS — Z419 Encounter for procedure for purposes other than remedying health state, unspecified: Secondary | ICD-10-CM

## 2015-08-05 DIAGNOSIS — M48061 Spinal stenosis, lumbar region without neurogenic claudication: Secondary | ICD-10-CM | POA: Diagnosis present

## 2015-08-05 DIAGNOSIS — Z23 Encounter for immunization: Secondary | ICD-10-CM | POA: Diagnosis not present

## 2015-08-05 DIAGNOSIS — M549 Dorsalgia, unspecified: Secondary | ICD-10-CM | POA: Diagnosis not present

## 2015-08-05 DIAGNOSIS — M4806 Spinal stenosis, lumbar region: Secondary | ICD-10-CM | POA: Diagnosis present

## 2015-08-05 DIAGNOSIS — M4726 Other spondylosis with radiculopathy, lumbar region: Secondary | ICD-10-CM | POA: Diagnosis not present

## 2015-08-05 DIAGNOSIS — R29898 Other symptoms and signs involving the musculoskeletal system: Secondary | ICD-10-CM

## 2015-08-05 DIAGNOSIS — Z9889 Other specified postprocedural states: Secondary | ICD-10-CM | POA: Diagnosis not present

## 2015-08-05 HISTORY — PX: LUMBAR LAMINECTOMY/DECOMPRESSION MICRODISCECTOMY: SHX5026

## 2015-08-05 LAB — HEPATIC FUNCTION PANEL
ALK PHOS: 92 U/L (ref 38–126)
ALT: 38 U/L (ref 17–63)
AST: 22 U/L (ref 15–41)
Albumin: 4 g/dL (ref 3.5–5.0)
BILIRUBIN DIRECT: 0.2 mg/dL (ref 0.1–0.5)
BILIRUBIN INDIRECT: 1.5 mg/dL — AB (ref 0.3–0.9)
Total Bilirubin: 1.7 mg/dL — ABNORMAL HIGH (ref 0.3–1.2)
Total Protein: 7.5 g/dL (ref 6.5–8.1)

## 2015-08-05 SURGERY — LUMBAR LAMINECTOMY/DECOMPRESSION MICRODISCECTOMY 3 LEVELS
Anesthesia: General | Site: Spine Lumbar

## 2015-08-05 MED ORDER — MIDAZOLAM HCL 2 MG/2ML IJ SOLN
INTRAMUSCULAR | Status: AC
  Filled 2015-08-05: qty 4

## 2015-08-05 MED ORDER — BUPIVACAINE HCL (PF) 0.5 % IJ SOLN
INTRAMUSCULAR | Status: DC | PRN
  Administered 2015-08-05: 20 mL
  Administered 2015-08-05: 5 mL

## 2015-08-05 MED ORDER — NEOSTIGMINE METHYLSULFATE 10 MG/10ML IV SOLN
INTRAVENOUS | Status: DC | PRN
  Administered 2015-08-05: 4 mg via INTRAVENOUS

## 2015-08-05 MED ORDER — ONDANSETRON HCL 4 MG/2ML IJ SOLN: 4.0000 mg | Freq: Once | INTRAMUSCULAR | Status: DC | PRN

## 2015-08-05 MED ORDER — SODIUM CHLORIDE 0.9 % IJ SOLN
3.0000 mL | Freq: Two times a day (BID) | INTRAMUSCULAR | Status: DC
  Administered 2015-08-05 – 2015-08-08 (×4): 3 mL via INTRAVENOUS

## 2015-08-05 MED ORDER — PROPOFOL 10 MG/ML IV BOLUS
INTRAVENOUS | Status: AC
  Filled 2015-08-05: qty 20

## 2015-08-05 MED ORDER — SENNA 8.6 MG PO TABS
1.0000 | ORAL_TABLET | Freq: Two times a day (BID) | ORAL | Status: DC
Start: 2015-08-05 — End: 2015-08-08
  Administered 2015-08-05 – 2015-08-08 (×6): 8.6 mg via ORAL
  Filled 2015-08-05 (×6): qty 1

## 2015-08-05 MED ORDER — FENTANYL CITRATE (PF) 250 MCG/5ML IJ SOLN
INTRAMUSCULAR | Status: DC | PRN
  Administered 2015-08-05: 100 ug via INTRAVENOUS
  Administered 2015-08-05 (×5): 50 ug via INTRAVENOUS

## 2015-08-05 MED ORDER — ALUM & MAG HYDROXIDE-SIMETH 200-200-20 MG/5ML PO SUSP: 30.0000 mL | Freq: Four times a day (QID) | ORAL | Status: DC | PRN

## 2015-08-05 MED ORDER — LIDOCAINE HCL (CARDIAC) 20 MG/ML IV SOLN
INTRAVENOUS | Status: DC | PRN
  Administered 2015-08-05: 50 mg via INTRAVENOUS

## 2015-08-05 MED ORDER — CEFAZOLIN SODIUM 1-5 GM-% IV SOLN
1.0000 g | Freq: Three times a day (TID) | INTRAVENOUS | Status: AC
  Administered 2015-08-05 (×2): 1 g via INTRAVENOUS
  Filled 2015-08-05 (×3): qty 50

## 2015-08-05 MED ORDER — SUCCINYLCHOLINE CHLORIDE 20 MG/ML IJ SOLN
INTRAMUSCULAR | Status: AC
  Filled 2015-08-05: qty 1

## 2015-08-05 MED ORDER — CEFAZOLIN SODIUM-DEXTROSE 2-3 GM-% IV SOLR
2.0000 g | INTRAVENOUS | Status: AC
Start: 2015-08-05 — End: 2015-08-05
  Administered 2015-08-05: 2 g via INTRAVENOUS
  Filled 2015-08-05: qty 50

## 2015-08-05 MED ORDER — GLYCOPYRROLATE 0.2 MG/ML IJ SOLN
INTRAMUSCULAR | Status: AC
  Filled 2015-08-05: qty 3

## 2015-08-05 MED ORDER — FENTANYL CITRATE (PF) 100 MCG/2ML IJ SOLN: 25.0000 ug | INTRAMUSCULAR | Status: DC | PRN

## 2015-08-05 MED ORDER — METHOCARBAMOL 500 MG PO TABS
500.0000 mg | ORAL_TABLET | Freq: Four times a day (QID) | ORAL | Status: DC | PRN
  Administered 2015-08-06 – 2015-08-07 (×2): 500 mg via ORAL
  Filled 2015-08-05 (×2): qty 1

## 2015-08-05 MED ORDER — HYDROMORPHONE HCL 1 MG/ML IJ SOLN: 0.5000 mg | INTRAMUSCULAR | Status: DC | PRN

## 2015-08-05 MED ORDER — PHENYLEPHRINE HCL 10 MG/ML IJ SOLN
INTRAMUSCULAR | Status: AC
  Filled 2015-08-05: qty 1

## 2015-08-05 MED ORDER — PHENYLEPHRINE HCL 10 MG/ML IJ SOLN
INTRAMUSCULAR | Status: DC | PRN
  Administered 2015-08-05: 40 ug via INTRAVENOUS
  Administered 2015-08-05 (×2): 80 ug via INTRAVENOUS

## 2015-08-05 MED ORDER — OXYCODONE HCL 5 MG/5ML PO SOLN: 5.0000 mg | Freq: Once | ORAL | Status: DC | PRN

## 2015-08-05 MED ORDER — SODIUM CHLORIDE 0.9 % IR SOLN
Status: DC | PRN
  Administered 2015-08-05: 500 mL

## 2015-08-05 MED ORDER — ROCURONIUM BROMIDE 100 MG/10ML IV SOLN
INTRAVENOUS | Status: DC | PRN
  Administered 2015-08-05: 30 mg via INTRAVENOUS
  Administered 2015-08-05: 20 mg via INTRAVENOUS

## 2015-08-05 MED ORDER — MIDAZOLAM HCL 5 MG/5ML IJ SOLN
INTRAMUSCULAR | Status: DC | PRN
  Administered 2015-08-05: 2 mg via INTRAVENOUS

## 2015-08-05 MED ORDER — POLYETHYLENE GLYCOL 3350 17 G PO PACK: 17.0000 g | PACK | Freq: Every day | ORAL | Status: DC | PRN

## 2015-08-05 MED ORDER — DOCUSATE SODIUM 100 MG PO CAPS
100.0000 mg | ORAL_CAPSULE | Freq: Two times a day (BID) | ORAL | Status: DC
  Administered 2015-08-05 – 2015-08-08 (×6): 100 mg via ORAL
  Filled 2015-08-05 (×6): qty 1

## 2015-08-05 MED ORDER — HYDROCODONE-ACETAMINOPHEN 5-325 MG PO TABS: 1.0000 | ORAL_TABLET | Freq: Four times a day (QID) | ORAL | Status: DC | PRN

## 2015-08-05 MED ORDER — THROMBIN 5000 UNITS EX SOLR
CUTANEOUS | Status: DC | PRN
  Administered 2015-08-05 (×2): 5000 [IU] via TOPICAL

## 2015-08-05 MED ORDER — ROCURONIUM BROMIDE 50 MG/5ML IV SOLN
INTRAVENOUS | Status: AC
  Filled 2015-08-05: qty 2

## 2015-08-05 MED ORDER — LIDOCAINE-EPINEPHRINE 1 %-1:100000 IJ SOLN
INTRAMUSCULAR | Status: DC | PRN
  Administered 2015-08-05: 5 mL

## 2015-08-05 MED ORDER — ONDANSETRON HCL 4 MG/2ML IJ SOLN
INTRAMUSCULAR | Status: AC
  Filled 2015-08-05: qty 2

## 2015-08-05 MED ORDER — DEXTROSE 5 % IV SOLN
500.0000 mg | Freq: Four times a day (QID) | INTRAVENOUS | Status: DC | PRN
  Filled 2015-08-05: qty 5

## 2015-08-05 MED ORDER — PHENYLEPHRINE HCL 10 MG/ML IJ SOLN
10.0000 mg | INTRAVENOUS | Status: DC | PRN
  Administered 2015-08-05: 10 ug/min via INTRAVENOUS

## 2015-08-05 MED ORDER — MAGNESIUM CITRATE PO SOLN
1.0000 | Freq: Once | ORAL | Status: DC | PRN
  Filled 2015-08-05: qty 296

## 2015-08-05 MED ORDER — KETOROLAC TROMETHAMINE 15 MG/ML IJ SOLN
15.0000 mg | Freq: Four times a day (QID) | INTRAMUSCULAR | Status: AC
  Administered 2015-08-05 – 2015-08-06 (×3): 15 mg via INTRAVENOUS
  Filled 2015-08-05 (×3): qty 1

## 2015-08-05 MED ORDER — OXYCODONE-ACETAMINOPHEN 5-325 MG PO TABS
1.0000 | ORAL_TABLET | ORAL | Status: DC | PRN
  Administered 2015-08-06 – 2015-08-07 (×3): 1 via ORAL
  Filled 2015-08-05 (×3): qty 1

## 2015-08-05 MED ORDER — PHENYLEPHRINE 40 MCG/ML (10ML) SYRINGE FOR IV PUSH (FOR BLOOD PRESSURE SUPPORT)
PREFILLED_SYRINGE | INTRAVENOUS | Status: AC
  Filled 2015-08-05: qty 10

## 2015-08-05 MED ORDER — GLYCOPYRROLATE 0.2 MG/ML IJ SOLN
INTRAMUSCULAR | Status: DC | PRN
  Administered 2015-08-05: 0.6 mg via INTRAVENOUS

## 2015-08-05 MED ORDER — ONDANSETRON HCL 4 MG/2ML IJ SOLN
INTRAMUSCULAR | Status: DC | PRN
  Administered 2015-08-05: 4 mg via INTRAVENOUS

## 2015-08-05 MED ORDER — FENTANYL CITRATE (PF) 250 MCG/5ML IJ SOLN
INTRAMUSCULAR | Status: AC
  Filled 2015-08-05: qty 5

## 2015-08-05 MED ORDER — HYDROCODONE-ACETAMINOPHEN 5-325 MG PO TABS
1.0000 | ORAL_TABLET | ORAL | Status: DC | PRN
  Administered 2015-08-07 (×2): 2 via ORAL
  Filled 2015-08-05 (×2): qty 2

## 2015-08-05 MED ORDER — SODIUM CHLORIDE 0.9 % IJ SOLN: 3.0000 mL | INTRAMUSCULAR | Status: DC | PRN

## 2015-08-05 MED ORDER — CEFAZOLIN SODIUM-DEXTROSE 2-3 GM-% IV SOLR
INTRAVENOUS | Status: AC
  Filled 2015-08-05: qty 50

## 2015-08-05 MED ORDER — INFLUENZA VAC SPLIT QUAD 0.5 ML IM SUSY
0.5000 mL | PREFILLED_SYRINGE | INTRAMUSCULAR | Status: DC
  Filled 2015-08-05: qty 0.5

## 2015-08-05 MED ORDER — OXYCODONE HCL 5 MG PO TABS: 5.0000 mg | ORAL_TABLET | Freq: Once | ORAL | Status: DC | PRN

## 2015-08-05 MED ORDER — BISACODYL 10 MG RE SUPP: 10.0000 mg | Freq: Every day | RECTAL | Status: DC | PRN

## 2015-08-05 MED ORDER — ONDANSETRON HCL 4 MG/2ML IJ SOLN: 4.0000 mg | INTRAMUSCULAR | Status: DC | PRN

## 2015-08-05 MED ORDER — 0.9 % SODIUM CHLORIDE (POUR BTL) OPTIME
TOPICAL | Status: DC | PRN
  Administered 2015-08-05: 1000 mL

## 2015-08-05 MED ORDER — DEXAMETHASONE SODIUM PHOSPHATE 10 MG/ML IJ SOLN
INTRAMUSCULAR | Status: DC | PRN
  Administered 2015-08-05: 10 mg via INTRAVENOUS

## 2015-08-05 MED ORDER — LACTATED RINGERS IV SOLN
INTRAVENOUS | Status: DC | PRN
  Administered 2015-08-05 (×3): via INTRAVENOUS

## 2015-08-05 MED ORDER — ACETAMINOPHEN 325 MG PO TABS: 650.0000 mg | ORAL_TABLET | ORAL | Status: DC | PRN

## 2015-08-05 MED ORDER — PHENOL 1.4 % MT LIQD: 1.0000 | OROMUCOSAL | Status: DC | PRN

## 2015-08-05 MED ORDER — MENTHOL 3 MG MT LOZG: 1.0000 | LOZENGE | OROMUCOSAL | Status: DC | PRN

## 2015-08-05 MED ORDER — SUCCINYLCHOLINE CHLORIDE 20 MG/ML IJ SOLN
INTRAMUSCULAR | Status: DC | PRN
  Administered 2015-08-05: 100 mg via INTRAVENOUS

## 2015-08-05 MED ORDER — NEOSTIGMINE METHYLSULFATE 10 MG/10ML IV SOLN
INTRAVENOUS | Status: AC
  Filled 2015-08-05: qty 1

## 2015-08-05 MED ORDER — THROMBIN 5000 UNITS EX SOLR
OROMUCOSAL | Status: DC | PRN
  Administered 2015-08-05: 5 mL via TOPICAL

## 2015-08-05 MED ORDER — PROPOFOL 10 MG/ML IV BOLUS
INTRAVENOUS | Status: DC | PRN
  Administered 2015-08-05: 200 mg via INTRAVENOUS

## 2015-08-05 MED ORDER — HEMOSTATIC AGENTS (NO CHARGE) OPTIME
TOPICAL | Status: DC | PRN
  Administered 2015-08-05: 1 via TOPICAL

## 2015-08-05 MED ORDER — ACETAMINOPHEN 650 MG RE SUPP: 650.0000 mg | RECTAL | Status: DC | PRN

## 2015-08-05 MED ORDER — LIDOCAINE HCL (CARDIAC) 20 MG/ML IV SOLN
INTRAVENOUS | Status: AC
  Filled 2015-08-05: qty 5

## 2015-08-05 MED ORDER — ARTIFICIAL TEARS OP OINT
TOPICAL_OINTMENT | OPHTHALMIC | Status: AC
  Filled 2015-08-05: qty 3.5

## 2015-08-05 SURGICAL SUPPLY — 62 items
ADH SKN CLS APL DERMABOND .7 (GAUZE/BANDAGES/DRESSINGS) ×1
BAG DECANTER FOR FLEXI CONT (MISCELLANEOUS) ×3 IMPLANT
BLADE CLIPPER SURG (BLADE) ×2 IMPLANT
BUR ACORN 6.0 (BURR) IMPLANT
BUR ACORN 6.0MM (BURR)
BUR MATCHSTICK NEURO 3.0 LAGG (BURR) ×3 IMPLANT
CANISTER SUCT 3000ML PPV (MISCELLANEOUS) ×3 IMPLANT
DECANTER SPIKE VIAL GLASS SM (MISCELLANEOUS) ×3 IMPLANT
DERMABOND ADVANCED (GAUZE/BANDAGES/DRESSINGS) ×2
DERMABOND ADVANCED .7 DNX12 (GAUZE/BANDAGES/DRESSINGS) ×1 IMPLANT
DRAPE LAPAROTOMY 100X72X124 (DRAPES) ×3 IMPLANT
DRAPE MICROSCOPE LEICA (MISCELLANEOUS) ×2 IMPLANT
DRAPE POUCH INSTRU U-SHP 10X18 (DRAPES) ×3 IMPLANT
DRAPE PROXIMA HALF (DRAPES) IMPLANT
DURAPREP 26ML APPLICATOR (WOUND CARE) ×3 IMPLANT
ELECT REM PT RETURN 9FT ADLT (ELECTROSURGICAL) ×3
ELECTRODE REM PT RTRN 9FT ADLT (ELECTROSURGICAL) ×1 IMPLANT
GAUZE SPONGE 4X4 12PLY STRL (GAUZE/BANDAGES/DRESSINGS) ×1 IMPLANT
GAUZE SPONGE 4X4 16PLY XRAY LF (GAUZE/BANDAGES/DRESSINGS) IMPLANT
GLOVE BIOGEL PI IND STRL 6.5 (GLOVE) IMPLANT
GLOVE BIOGEL PI IND STRL 7.0 (GLOVE) IMPLANT
GLOVE BIOGEL PI IND STRL 7.5 (GLOVE) IMPLANT
GLOVE BIOGEL PI IND STRL 8 (GLOVE) IMPLANT
GLOVE BIOGEL PI IND STRL 8.5 (GLOVE) ×1 IMPLANT
GLOVE BIOGEL PI INDICATOR 6.5 (GLOVE) ×2
GLOVE BIOGEL PI INDICATOR 7.0 (GLOVE) ×2
GLOVE BIOGEL PI INDICATOR 7.5 (GLOVE) ×2
GLOVE BIOGEL PI INDICATOR 8 (GLOVE) ×4
GLOVE BIOGEL PI INDICATOR 8.5 (GLOVE) ×8
GLOVE ECLIPSE 7.0 STRL STRAW (GLOVE) ×18 IMPLANT
GLOVE ECLIPSE 7.5 STRL STRAW (GLOVE) ×6 IMPLANT
GLOVE ECLIPSE 8.5 STRL (GLOVE) ×9 IMPLANT
GLOVE EXAM NITRILE LRG STRL (GLOVE) IMPLANT
GLOVE EXAM NITRILE MD LF STRL (GLOVE) IMPLANT
GLOVE EXAM NITRILE XL STR (GLOVE) IMPLANT
GLOVE EXAM NITRILE XS STR PU (GLOVE) IMPLANT
GLOVE SS BIOGEL STRL SZ 7 (GLOVE) IMPLANT
GLOVE SUPERSENSE BIOGEL SZ 7 (GLOVE) ×2
GOWN STRL REUS W/ TWL LRG LVL3 (GOWN DISPOSABLE) IMPLANT
GOWN STRL REUS W/ TWL XL LVL3 (GOWN DISPOSABLE) IMPLANT
GOWN STRL REUS W/TWL 2XL LVL3 (GOWN DISPOSABLE) ×9 IMPLANT
GOWN STRL REUS W/TWL LRG LVL3 (GOWN DISPOSABLE) ×6
GOWN STRL REUS W/TWL XL LVL3 (GOWN DISPOSABLE)
HEMOSTAT POWDER SURGIFOAM 1G (HEMOSTASIS) ×2 IMPLANT
KIT BASIN OR (CUSTOM PROCEDURE TRAY) ×3 IMPLANT
KIT ROOM TURNOVER OR (KITS) ×3 IMPLANT
NDL SPNL 20GX3.5 QUINCKE YW (NEEDLE) IMPLANT
NEEDLE HYPO 22GX1.5 SAFETY (NEEDLE) ×3 IMPLANT
NEEDLE SPNL 20GX3.5 QUINCKE YW (NEEDLE) ×3 IMPLANT
NS IRRIG 1000ML POUR BTL (IV SOLUTION) ×3 IMPLANT
PACK LAMINECTOMY NEURO (CUSTOM PROCEDURE TRAY) ×3 IMPLANT
PAD ARMBOARD 7.5X6 YLW CONV (MISCELLANEOUS) ×13 IMPLANT
PATTIES SURGICAL .5 X1 (DISPOSABLE) ×3 IMPLANT
RUBBERBAND STERILE (MISCELLANEOUS) ×4 IMPLANT
SPONGE SURGIFOAM ABS GEL SZ50 (HEMOSTASIS) ×3 IMPLANT
SUT VIC AB 1 CT1 18XBRD ANBCTR (SUTURE) ×1 IMPLANT
SUT VIC AB 1 CT1 8-18 (SUTURE) ×6
SUT VIC AB 2-0 CP2 18 (SUTURE) ×3 IMPLANT
SUT VIC AB 3-0 SH 8-18 (SUTURE) ×3 IMPLANT
TOWEL OR 17X24 6PK STRL BLUE (TOWEL DISPOSABLE) ×3 IMPLANT
TOWEL OR 17X26 10 PK STRL BLUE (TOWEL DISPOSABLE) ×3 IMPLANT
WATER STERILE IRR 1000ML POUR (IV SOLUTION) ×3 IMPLANT

## 2015-08-05 NOTE — Transfer of Care (Signed)
Immediate Anesthesia Transfer of Care Note  Patient: Charles Reed  Procedure(s) Performed: Procedure(s): Laminectomy and Foraminotomy - Lumbar One-Two,Lumbar Two-Three,Lumbar Three-Four (N/A)  Patient Location: PACU  Anesthesia Type:General  Level of Consciousness: sedated  Airway & Oxygen Therapy: Patient Spontanous Breathing and Patient connected to face mask oxygen  Post-op Assessment: Report given to RN and Post -op Vital signs reviewed and stable  Post vital signs: Reviewed and stable  Last Vitals:  Filed Vitals:   08/05/15 0549  BP: 175/95  Pulse: 69  Temp: 36.3 C  Resp: 20    Complications: No apparent anesthesia complications

## 2015-08-05 NOTE — Op Note (Signed)
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 Postoperative diagnosis: Lumbar spinal stenosis with neurogenic claudication and lumbar radiculopathy L1-2 L2-3 L3-4. Procedure: Bilateral laminotomies and foraminotomies L1 to L2-3 L3-4 with decompression of the L1 L2 L3 and L4 nerve roots. Surgeon: Kristeen Miss First assistant: Daun Peacock M.D. Anesthesia: Gen. endotracheal Indications: 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 After careful review of his films in his situation I advised surgical decompression with laminotomy foraminotomy bilaterally at L1-2 L2-3 L3-4.  Procedure: Patient was brought to the operating room supine on a stretcher. After the smooth induction of general endotracheal anesthesia, a Foley catheter was placed, and the patient was turned prone. The bony prominences were appropriately padded and protected. The back was prepped with alcohol and DuraPrep and draped in a sterile fashion. A midline incision was created in the lower lumbar spine and dissection was carried down to the lumbar dorsal fascia. The lumbar dorsal fascia was opened on either side of midline in the first 2 spinous processes that were uncovered were identified positively on the radiograph is L2 and L3. With this then the interlaminar spaces at L1-2 L2-3 and L3-4 isolated. Starting at L2-3 laminotomy was created on the right side. Significant overgrowth from the medial aspect of the facet was encountered. This was drilled down and thick and redundant yellow ligament was removed. At the same time then a laminotomy was then created on the left side and similar hypertrophy was encountered liver was greater on one side than the other. The central canal was then  decompressed and gradually the decompression was carried superiorly and laterally on either side to expose the common dural tube superiorly the take off of the L2 nerve root and inferiorly the take off of the L3 nerve root which were all decompressed with a 2 mm Kerrison punch. Curved Kerrisons were also used to facilitate this decompression. The high-speed drill was used very carefully and judiciously always protecting the dura. Once the decompression at this level was adequate attention was turned to L3-4 here substantially larger facet overgrowth was found on the left side. This was decompressed in undercut so as to decompress the L3 nerve root superiorly and the L4 nerve root inferiorly. Care again was taken to protect the dura at every stage of the procedure. Once L3-4 was decompressed attention was turned to L1-2 and again care was taken to protect the dura and decompressed the L1 nerve root superiorly the L2 nerve root inferiorly. Same combination of curettes and rongeurs straight and curved Kerrison punches was used to facilitate this decompression. In the end hemostasis was achieved in the epidural spaces and the soft tissue spaces. Final radiograph was obtained to identify the decompressions were performed superiorly enough above the disc space when this was verified radiographically he then proceeded to close lumbar dorsal fascia with #1 Vicryls interrupted fashion 20 Vicryls using the subcutaneous tissues and 30 Vicryls close the subcutaneous take her skin. Blood loss was estimated at 200 mL. The patient tolerated the procedure well and was returned to recovery room in stable condition. 20 mL of half percent Marcaine was injected into the paraspinous fascia also at the time of closure.

## 2015-08-05 NOTE — Evaluation (Signed)
Physical Therapy Evaluation Patient Details Name: Charles Reed MRN: [id number] DOB: Jul 07, 1946 Today's Date: 08/05/2015   History of Present Illness  Patient is a 69 y/o male s/p Bilateral laminotomies and foraminotomies L1 to L2-3 L3-4 with decompression of the L1 L2 L3 and L4 nerve roots. PMH unremarkable.  Clinical Impression  Patient presents with balance deficits and weakness s/p above surgery impacting mobility. Educated pt on back precautions. Performing bed mobility, transfers and ambulation with close Min guard for safety. Plan for stair training tomorrow prior to discharge. Pt will need to be Mod I with mobility prior to discharge as wife works during the day. Encouraged ambulation daily with RN. Will follow acutely to maximize independence and mobility prior to return home.    Follow Up Recommendations Outpatient PT;Supervision - Intermittent    Equipment Recommendations  None recommended by PT    Recommendations for Other Services OT consult     Precautions / Restrictions Precautions Precautions: Back Precaution Comments: Reviewed back precautions Restrictions Weight Bearing Restrictions: No      Mobility  Bed Mobility Overal bed mobility: Needs Assistance Bed Mobility: Rolling;Sidelying to Sit;Sit to Sidelying Rolling: Supervision Sidelying to sit: Supervision     Sit to sidelying: Supervision General bed mobility comments: HOB flat, no use of rails to simulate home environment. Cues for log roll technique.  Transfers Overall transfer level: Needs assistance Equipment used: None Transfers: Sit to/from Stand Sit to Stand: Min guard         General transfer comment: Min guard for safety as pt reports some "wooziness."   Ambulation/Gait Ambulation/Gait assistance: Min guard Ambulation Distance (Feet): 150 Feet Assistive device: None Gait Pattern/deviations: Step-through pattern;Decreased stride length   Gait velocity interpretation: <1.8 ft/sec,  indicative of risk for recurrent falls General Gait Details: Slow, guarded gait. Initially unsteady with some sway however improved with increased distance.   Stairs            Wheelchair Mobility    Modified Rankin (Stroke Patients Only)       Balance Overall balance assessment: Needs assistance Sitting-balance support: Feet supported;No upper extremity supported Sitting balance-Leahy Scale: Fair     Standing balance support: During functional activity Standing balance-Leahy Scale: Fair                               Pertinent Vitals/Pain Pain Assessment: No/denies pain    Home Living Family/patient expects to be discharged to:: Private residence Living Arrangements: Spouse/significant other Available Help at Discharge: Family;Available PRN/intermittently (Wife works) Type of Home: House Home Access: Stairs to enter Entrance Stairs-Rails: Right Entrance Stairs-Number of Steps: 3 Home Layout: One level Home Equipment: None      Prior Function Level of Independence: Independent         Comments: In multiple bowling leagues.     Hand Dominance        Extremity/Trunk Assessment   Upper Extremity Assessment: Defer to OT evaluation           Lower Extremity Assessment: Generalized weakness (No numbness/tingling.)         Communication   Communication: No difficulties  Cognition Arousal/Alertness: Awake/alert Behavior During Therapy: WFL for tasks assessed/performed Overall Cognitive Status: Within Functional Limits for tasks assessed                      General Comments General comments (skin integrity, edema, etc.): Spouse present during session.  Exercises        Assessment/Plan    PT Assessment Patient needs continued PT services  PT Diagnosis Difficulty walking   PT Problem List Decreased strength;Decreased mobility;Decreased balance;Decreased knowledge of precautions  PT Treatment Interventions Balance  training;Gait training;Stair training;Functional mobility training;Therapeutic activities;Therapeutic exercise;Patient/family education   PT Goals (Current goals can be found in the Care Plan section) Acute Rehab PT Goals Patient Stated Goal: to get back to bowling. PT Goal Formulation: With patient Time For Goal Achievement: 08/19/15 Potential to Achieve Goals: Good    Frequency Min 5X/week   Barriers to discharge Decreased caregiver support Pt will be home alone as wife works.    Co-evaluation               End of Session Equipment Utilized During Treatment: Gait belt Activity Tolerance: Patient tolerated treatment well Patient left: in bed;with call bell/phone within reach;with family/visitor present Nurse Communication: Mobility status         Time: 2583-4621 PT Time Calculation (min) (ACUTE ONLY): 25 min   Charges:   PT Evaluation $Initial PT Evaluation Tier I: 1 Procedure PT Treatments $Gait Training: 8-22 mins   PT G Codes:        Dwyne Hasegawa A Jacody Beneke 08/05/2015, 4:18 PM Wray Kearns, [place], DPT [phone]

## 2015-08-05 NOTE — H&P (Signed)
CHIEF COMPLAINT:                                          Back pain and right flank pain and right buttock pain.    HISTORY OF PRESENT ILLNESS:                     [name] is a 69 year old, right-handed individual who notes that he has developed significant pain in the right buttock and right flank that got acutely worse last night.  He was seen in the emergency department at [facility] where an AP of the pelvis was performed.  This revealed the presence of some significant bone spurs at multiple levels in the lower lumbar spine, namely L3-4 and L4-5, but also a small spur on the right side in the region of the sacrum.  His hip joints and the pelvic ring appear to be intact.  He is seen now for further evaluation of this process, and he notes that the pain has been quite severe with radiation into the right posterior thigh at times.  Most of the pain remains in the buttock and in the region of the right flank.  He feels that he may have aggravated this pain with a recent increase in his frequency of bowling.  He bowls normally once a week, but in the last week or so he has had opportunity to bowl on three separate nights, and last night particularly seemed to aggravate the pain substantially to the point where his right leg felt that it was going to collapse.    PAST MEDICAL HISTORY:                                Reveals that he has generally had excellent health.  He reports no significant medical problems.    . Medications and Allergies:  Takes no medication regularly.  Current medications only include a hydrocodone that he was prescribed yesterday and cephalexin capsules.    FAMILY HISTORY:                                            Reveals that his mother is age 58 and has some hypertension.  Father's history is unknown, but there was a history of diabetes and high blood pressure in the family.    SOCIAL HISTORY:                                            Personal history reveals  that he does not smoke.  He drinks alcohol socially.   REVIEW OF SYSTEMS:                                    Notable for wearing of glasses, ringing in the ears, and the back pain on a 14-Point Review Sheet.    PHYSICAL EXAMINATION:  Height and weight have been stable at 5 feet 8-3/4 inches and 196 pounds.  On physical examination, he stands straight and erect.  He is able to flex forward plus 60 degrees.  He extends normally.  Axial compression in his shoulders and neck does not reproduce any pain.  His motor function appears good in the iliopsoas, the quadriceps, the tibialis anterior, and the gastrocs as noted by toe and heel walking.  His station and gait are normal.  His deep tendon reflexes are 2+ in the patellae and the Achilles both.  Sensation is slightly diminished in the distal lower extremities, but straight leg raising reproduces back and leg pain in either lower extremity at 45 degrees.  Patrick's maneuver is negative.    Impression and Plan: HOPI:                                                   [name] had an MRI of the lumbar spine.  The study demonstrates that there is severe stenosis at three levels, namely L1-L2, L2-L3, and L3-L4.  At L4-5 [name] has some mild to moderate spinal stenosis and very mild lateral recess stenosis as he does also at L5-S1.  However, I demonstrated the findings of the stenosis to him on the MRI and noted that these areas are severe enough to cause nerve damage and also compression of the nerves to aggravate the symptoms that he is experiencing.              I obtained a lateral flexion/extension of the lumbar spine in addition to a singular AP view. The radiographs demonstrate that he has prominent lateral osteophytes, particularly at L3-4 on the left side.  He also has some moderate disk degenerative changes at multiple levels as identified on the MRI.  However, between flexion and extension he does not develop any  significant listhesis at any level and the alignment of his spine is maintained fairly well on the coronal plane and the sagittal images.  PLAN:                             In that regard, we can treat this process by doing a simple decompression via laminectomy and foraminotomy and undercutting the spine adequately to open up the foramen on either side at L1-2, L2-3, and L3-4.  I indicated that the surgery itself takes a couple of hours.  The singular biggest risk of the surgery is that of a spinal fluid leak.  This could prolong his hospital stay if such should occur to allow the leak to heal.  Nonetheless, I do not see there is any instability on [name] back and I would be hopeful that a decompression should alleviate the worst of his symptoms to allow him a good recovery in about six to eight weeks and a full recovery in about four or five  months.  I discussed the surgery with him, demonstrating on a model the nature of the approach through the back to access the lamina and then drilling out by undercutting the laminar arches adequately.  I noted that though the surgery is a substantial undertaking I am hopeful that it should give him the relief that he so desires. He is admitted for surgery now.

## 2015-08-05 NOTE — Anesthesia Procedure Notes (Signed)
Procedure Name: Intubation Date/Time: 08/05/2015 7:40 AM Performed by: Maude Leriche D Pre-anesthesia Checklist: Patient identified, Emergency Drugs available, Suction available, Patient being monitored and Timeout performed Patient Re-evaluated:Patient Re-evaluated prior to inductionOxygen Delivery Method: Circle system utilized Preoxygenation: Pre-oxygenation with 100% oxygen Intubation Type: IV induction Ventilation: Mask ventilation without difficulty Laryngoscope Size: Mac and 4 (First attempt with Sabra Heck 2- unable to pick up epiglottis. Mac 4 used with success) Grade View: Grade II Tube type: Oral Tube size: 7.5 mm Number of attempts: 2 (Dl x 1 by CRNA. Unable to pick up epiglottis, DL x 1 by MDA with Mac 4- successful placement of ETT) Airway Equipment and Method: Stylet Placement Confirmation: ETT inserted through vocal cords under direct vision,  positive ETCO2 and breath sounds checked- equal and bilateral Secured at: 23 cm Tube secured with: Tape Dental Injury: Teeth and Oropharynx as per pre-operative assessment

## 2015-08-05 NOTE — Plan of Care (Signed)
Problem: Consults Goal: Diagnosis - Spinal Surgery Outcome: Completed/Met Date Met:  08/05/15 Lumbar Laminectomy (Complex)

## 2015-08-05 NOTE — Anesthesia Preprocedure Evaluation (Addendum)
Anesthesia Evaluation  Patient identified by MRN, date of birth, ID band Patient awake    Reviewed: Allergy & Precautions, NPO status , Patient's Chart, lab work & pertinent test results  Airway Mallampati: I  TM Distance: >3 FB     Dental  (+) Teeth Intact, Dental Advisory Given   Pulmonary former smoker,    Pulmonary exam normal        Cardiovascular Normal cardiovascular exam     Neuro/Psych    GI/Hepatic (+) Hepatitis -, B  Endo/Other    Renal/GU      Musculoskeletal  (+) Arthritis ,   Abdominal   Peds  Hematology   Anesthesia Other Findings   Reproductive/Obstetrics                           Anesthesia Physical Anesthesia Plan  ASA: II  Anesthesia Plan: General   Post-op Pain Management:    Induction: Intravenous  Airway Management Planned: Oral ETT  Additional Equipment:   Intra-op Plan:   Post-operative Plan: Extubation in OR  Informed Consent: I have reviewed the patients History and Physical, chart, labs and discussed the procedure including the risks, benefits and alternatives for the proposed anesthesia with the patient or authorized representative who has indicated his/her understanding and acceptance.   Dental advisory given  Plan Discussed with: CRNA, Anesthesiologist and Surgeon  Anesthesia Plan Comments:        Anesthesia Quick Evaluation

## 2015-08-05 NOTE — Care Management (Signed)
Utilization review completed. Parker Sawatzky, RN Case Manager [phone]. 

## 2015-08-06 ENCOUNTER — Inpatient Hospital Stay (HOSPITAL_COMMUNITY): Payer: Medicare Other

## 2015-08-06 DIAGNOSIS — M4726 Other spondylosis with radiculopathy, lumbar region: Secondary | ICD-10-CM | POA: Diagnosis not present

## 2015-08-06 MED ORDER — OXYCODONE-ACETAMINOPHEN 5-325 MG PO TABS: 1.0000 | ORAL_TABLET | ORAL | Status: DC | PRN

## 2015-08-06 MED ORDER — METHOCARBAMOL 500 MG PO TABS: 500.0000 mg | ORAL_TABLET | Freq: Four times a day (QID) | ORAL | Status: DC | PRN

## 2015-08-06 MED ORDER — INFLUENZA VAC SPLIT QUAD 0.5 ML IM SUSY
0.5000 mL | PREFILLED_SYRINGE | Freq: Once | INTRAMUSCULAR | Status: AC
  Administered 2015-08-06: 0.5 mL via INTRAMUSCULAR
  Filled 2015-08-06: qty 0.5

## 2015-08-06 NOTE — Progress Notes (Signed)
Patient ID: Charles Reed, male   DOB: 1946/02/28, 69 y.o.   MRN: [id number] Vital signs are stable Motor function appears to be intact Dressing is clean and dry Patient notes less numbness and tingling in legs Plan discharge today

## 2015-08-06 NOTE — Progress Notes (Signed)
Physical Therapy Treatment Patient Details Name: Charles Reed MRN: [id number] DOB: [birth year] Today's Date: 08/06/2015    History of Present Illness Patient is a 69 y/o male s/p Bilateral laminotomies and foraminotomies L1 to L2-3 L3-4 with decompression of the L1 L2 L3 and L4 nerve roots. PMH unremarkable.    PT Comments    See below for other mobility performance. On initial ambulation, pt guarded/unsteady due to "legs feel like jello." Denied numbness/tingling. When attempting stair training, pt attempted to step up with RLE first and his knee completely buckled with mod assist to prevent fall. Patient reported this is new weakness. Patient was able to do stairs with LLE lead, sideways with rail; however, pt has 3 steps with NO rail (or can walk longer distance on uneven ground to 1 step entry with no rail). Currently pt could not safely go up/down steps with his wife. Notified RN. She contacted neurosurgeon (whom I also spoke with). MD plans to perform MRI.    Follow Up Recommendations  Home health PT;Supervision for mobility/OOB     Equipment Recommendations  Other (comment) (may need RW with new onset RLE weakness; TBA post-MRI)    Recommendations for Other Services       Precautions / Restrictions Precautions Precautions: Back Precaution Comments: Patient able to recall precautions and maintains precautions with functional mobility.  Restrictions Weight Bearing Restrictions: No    Mobility  Bed Mobility Overal bed mobility: Needs Assistance Bed Mobility: Rolling;Sidelying to Sit;Sit to Sidelying Rolling: Supervision Sidelying to sit: Supervision     Sit to sidelying: Supervision General bed mobility comments: HOB flat, no use of rails to simulate home environment. Cues for log roll technique.  Transfers Overall transfer level: Needs assistance Equipment used: None Transfers: Sit to/from Stand Sit to Stand: Min guard         General transfer comment: x2  with armrest; slight unsteadiness as noted "legs feel like jelly"  Ambulation/Gait Ambulation/Gait assistance: Min guard Ambulation Distance (Feet): 400 Feet Assistive device: None Gait Pattern/deviations: Step-through pattern;Decreased stride length;Wide base of support (guarded) Gait velocity: slow   General Gait Details: Slow, guarded gait. As began to walk he noted his legs felt "jello-ish"/quivering. As we reached hall he felt need to hold onto railing to steady himself. He reported this was a new sensation not present earlier today, yesterday or pre-op.    Stairs Stairs: Yes Stairs assistance: Mod assist Stair Management: One rail Right;Step to pattern;Sideways;Forwards Number of Stairs: 3 General stair comments: see assessment/clinical impression for events  Wheelchair Mobility    Modified Rankin (Stroke Patients Only)       Balance   Sitting-balance support: No upper extremity supported;Feet supported Sitting balance-Leahy Scale: Normal     Standing balance support: No upper extremity supported Standing balance-Leahy Scale: Fair                      Cognition Arousal/Alertness: Awake/alert Behavior During Therapy: WFL for tasks assessed/performed Overall Cognitive Status: Within Functional Limits for tasks assessed                      Exercises      General Comments        Pertinent Vitals/Pain Pain Assessment: 0-10 Pain Score: 2  Pain Location: back Pain Descriptors / Indicators: Operative site guarding Pain Intervention(s): Limited activity within patient's tolerance;Monitored during session;Repositioned    Home Living Family/patient expects to be discharged to:: Private residence Living Arrangements: Spouse/significant other  Available Help at Discharge: Family;Available PRN/intermittently Type of Home: House Home Access: Stairs to enter Entrance Stairs-Rails: Right Home Layout: One level Home Equipment: Shower seat;Adaptive  equipment      Prior Function Level of Independence: Independent      Comments: In multiple bowling leagues.   PT Goals (current goals can now be found in the care plan section) Acute Rehab PT Goals Patient Stated Goal: to get back to bowling. Time For Goal Achievement: 08/19/15 Progress towards PT goals: Progressing toward goals    Frequency  Min 5X/week    PT Plan Discharge plan needs to be updated    Co-evaluation             End of Session Equipment Utilized During Treatment: Gait belt Activity Tolerance: Patient tolerated treatment well (although limited on stairs) Patient left: in chair;with call bell/phone within reach     Time: 1478-2956 PT Time Calculation (min) (ACUTE ONLY): 23 min  Charges:  $Gait Training: 23-37 mins                    G Codes:      Charles Reed 2015-09-01, 11:49 AM Pager [phone]

## 2015-08-06 NOTE — Discharge Summary (Signed)
Physician Discharge Summary  Patient ID: Charles Reed MRN: [id number] DOB/AGE: 71-Oct-1947 71 y.o.  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 He tolerated surgery well.  Consults: None  Significant Diagnostic Studies: None  Treatments: surgery: Laminotomies and foraminotomies L1 to L2-3 L3-4  Discharge Exam: Blood pressure 106/55, pulse 56, temperature 98 F (36.7 C), temperature source Oral, resp. rate 20, weight 85.73 kg (189 lb), SpO2 100 %. Incision is clean and dry station and gait are intact.  Disposition: 01-Home or Self Care  Discharge Instructions    Call MD for:  redness, tenderness, or signs of infection (pain, swelling, redness, odor or green/yellow discharge around incision site)    Complete by:  As directed      Call MD for:  severe uncontrolled pain    Complete by:  As directed      Call MD for:  temperature >100.4    Complete by:  As directed      Diet - low sodium heart healthy    Complete by:  As directed      Discharge instructions    Complete by:  As directed   Okay to shower. Do not apply salves or appointments to incision. No heavy lifting with the upper extremities greater than 15 pounds. May resume driving when not requiring pain medication and patient feels comfortable with doing so.     Increase activity slowly    Complete by:  As directed             Medication List    TAKE these medications        clindamycin 150 MG capsule  Commonly known as:  CLEOCIN  Take by mouth 3 (three) times daily.     Fish Oil 1200 MG Caps  Take 1  capsule by mouth daily.     HYDROcodone-acetaminophen 5-325 MG tablet  Commonly known as:  NORCO/VICODIN  Take 1-2 tablets by mouth every 6 (six) hours as needed for moderate pain.     ibuprofen 200 MG tablet  Commonly known as:  ADVIL,MOTRIN  Take 400 mg by mouth every 6 (six) hours as needed (for pain).     methocarbamol 500 MG tablet  Commonly known as:  ROBAXIN  Take 1 tablet (500 mg total) by mouth every 6 (six) hours as needed for muscle spasms.     oxyCODONE-acetaminophen 5-325 MG tablet  Commonly known as:  PERCOCET/ROXICET  Take 1-2 tablets by mouth every 4 (four) hours as needed for moderate pain.         SignedEarleen Newport 08/06/2015, 4:49 AM

## 2015-08-06 NOTE — Progress Notes (Signed)
MD called to notify that patient's result is back.

## 2015-08-06 NOTE — Evaluation (Signed)
Occupational Therapy Evaluation Patient Details Name: Charles Reed MRN: [id number] DOB: 04/02/1946 Today's Date: 08/06/2015    History of Present Illness Patient is a 69 y/o male s/p Bilateral laminotomies and foraminotomies L1 to L2-3 L3-4 with decompression of the L1 L2 L3 and L4 nerve roots. PMH unremarkable.   Clinical Impression   Pt is at min A level with LB ADLs and sup with functional/ADL mobility. Pt educated on ADL A/E for home use. All education completed and no further acute OT indicated at this time    Follow Up Recommendations  No OT follow up;Supervision - Intermittent    Equipment Recommendations  Other (comment) (ADL A/E kit)    Recommendations for Other Services       Precautions / Restrictions Precautions Precautions: Back Precaution Comments: pt able to recall all back precautions and demonstrate log roll technique Restrictions Weight Bearing Restrictions: No      Mobility Bed Mobility Overal bed mobility: Needs Assistance Bed Mobility: Rolling;Sidelying to Sit;Sit to Sidelying Rolling: Supervision Sidelying to sit: Supervision     Sit to sidelying: Supervision General bed mobility comments: HOB flat, no use of rails to simulate home environment. Cues for log roll technique.  Transfers Overall transfer level: Needs assistance Equipment used: None Transfers: Sit to/from Stand Sit to Stand: Supervision              Balance   Sitting-balance support: No upper extremity supported;Feet supported Sitting balance-Leahy Scale: Normal     Standing balance support: No upper extremity supported;During functional activity Standing balance-Leahy Scale: Fair                              ADL Overall ADL's : Needs assistance/impaired     Grooming: Wash/dry hands;Wash/dry face;Standing;Supervision/safety   Upper Body Bathing: Set up;Supervision/ safety;Sitting   Lower Body Bathing: Minimal assistance   Upper Body Dressing :  Supervision/safety;Set up;Sitting   Lower Body Dressing: Minimal assistance   Toilet Transfer: Supervision/safety;Grab bars;Regular Toilet;Ambulation   Toileting- Clothing Manipulation and Hygiene: Supervision/safety;Sit to/from stand   Tub/ Shower Transfer: Supervision/safety;Grab bars;Shower seat   Functional mobility during ADLs: Supervision/safety General ADL Comments: Pt and his significant other provided with education and demo of ADL A/E for home use     Vision  wears glasses, no change from baseline   Perception Perception Perception Tested?: No   Praxis Praxis Praxis tested?: Not tested    Pertinent Vitals/Pain Pain Assessment: 0-10 Pain Score: 1  Pain Location: back Pain Descriptors / Indicators: Operative site guarding Pain Intervention(s): Monitored during session;Repositioned     Hand Dominance Right   Extremity/Trunk Assessment Upper Extremity Assessment Upper Extremity Assessment: Overall WFL for tasks assessed   Lower Extremity Assessment Lower Extremity Assessment: Defer to PT evaluation   Cervical / Trunk Assessment Cervical / Trunk Assessment: Normal   Communication Communication Communication: No difficulties   Cognition Arousal/Alertness: Awake/alert Behavior During Therapy: WFL for tasks assessed/performed Overall Cognitive Status: Within Functional Limits for tasks assessed                     General Comments   pt very pleasant and cooperative                 Home Living Family/patient expects to be discharged to:: Private residence Living Arrangements: Spouse/significant other Available Help at Discharge: Family;Available PRN/intermittently Type of Home: House Home Access: Stairs to enter Entrance Stairs-Number of Steps: 3 Entrance   Stairs-Rails: Right Home Layout: One level     Bathroom Shower/Tub: Tub/shower unit;Walk-in shower   Bathroom Toilet: Standard     Home Equipment: Shower seat;Adaptive  equipment Adaptive Equipment: Reacher        Prior Functioning/Environment Level of Independence: Independent        Comments: In multiple bowling leagues.    OT Diagnosis: Acute pain   OT Problem List: Decreased knowledge of use of DME or AE;Impaired balance (sitting and/or standing)   OT Treatment/Interventions:      OT Goals(Current goals can be found in the care plan section) Acute Rehab OT Goals Patient Stated Goal: to get back to bowling. OT Goal Formulation: With patient/family Time For Goal Achievement: 08/06/15 Potential to Achieve Goals: Good  OT Frequency:     Barriers to D/C:                          End of Session    Activity Tolerance: Patient tolerated treatment well Patient left: in bed;with call bell/phone within reach;with nursing/sitter in room;with family/visitor present   Time: 0906-0935 OT Time Calculation (min): 29 min Charges:  OT General Charges $OT Visit: 1 Procedure OT Evaluation $Initial OT Evaluation Tier I: 1 Procedure OT Treatments $Therapeutic Activity: 8-22 mins G-Codes:    [name], [name] 08/06/2015, 10:35 AM    

## 2015-08-06 NOTE — Progress Notes (Signed)
Patient is being d/c home. D/c instruction given and patient verbalized understanding. Awaiting transport

## 2015-08-07 MED ORDER — DEXAMETHASONE SODIUM PHOSPHATE 4 MG/ML IJ SOLN
2.0000 mg | Freq: Four times a day (QID) | INTRAMUSCULAR | Status: DC
  Administered 2015-08-07 – 2015-08-08 (×5): 2 mg via INTRAVENOUS
  Filled 2015-08-07 (×5): qty 1

## 2015-08-07 MED ORDER — DEXAMETHASONE SODIUM PHOSPHATE 10 MG/ML IJ SOLN
10.0000 mg | Freq: Once | INTRAMUSCULAR | Status: AC
  Administered 2015-08-07: 10 mg via INTRAVENOUS
  Filled 2015-08-07: qty 1

## 2015-08-07 NOTE — Progress Notes (Signed)
Physical Therapy Treatment Patient Details Name: Charles Reed MRN: [id number] DOB: Jul 13, 1946 Today's Date: 08/07/2015    History of Present Illness Patient is a 69 y/o male s/p Bilateral laminotomies and foraminotomies L1 to L2-3 L3-4 with decompression of the L1 L2 L3 and L4 nerve roots. 10/22 noted new onset Rt quad weakness (3/5); MD notified; MRI negative; 10/23 Rt quad weakness improved 4/5, however new numbness Rt inner lower leg PMH unremarkable.    PT Comments    Pt's Rt quad weakness improved, but remains 4/5 and new onset numbness/tingling inner Rt lower leg. Educated on use of crutches and pt able to perform stairs with minguard assist and cues for sequence (using LLE for primary support). Rt knee continues to buckle when attempts to use it as primary support ascending or descending step. Above discussed with Dr. Carney Living and pt's RN.   Follow Up Recommendations  Home health PT;Supervision for mobility/OOB     Equipment Recommendations  Crutches    Recommendations for Other Services       Precautions / Restrictions Precautions Precautions: Back Precaution Comments: Patient able to recall precautions and maintains precautions with functional mobility.  Restrictions Weight Bearing Restrictions: No    Mobility  Bed Mobility               General bed mobility comments: sitting EOB on arrival; can verbalize correct technique  Transfers Overall transfer level: Needs assistance Equipment used: None Transfers: Sit to/from Stand Sit to Stand: Supervision         General transfer comment: denied dizziness (BP improved from a.m.); supervision with cues with stand to sit (trying to keep back straight, he shifts his weight/shoulders posteriorly too soon with rapid descent (catches himself on his hands); encouraged hip flexion as lowering and demonstrated difference between back flexion and hip flexion  Ambulation/Gait Ambulation/Gait assistance: Min  guard Ambulation Distance (Feet): 250 Feet Assistive device: None;Crutches Gait Pattern/deviations: Step-through pattern;Decreased stride length Gait velocity: slow   General Gait Details: Slow, guarded gait. Reports RLE still feels unsteady, however improved from 10/22. Did note incr numbness/tingling inner lower leg down to 1st toe in standing. Educated in use of crutches (especially for uneven surfaces i.e. if he walks through grass to back step)   Stairs Stairs: Yes Stairs assistance: Min assist Stair Management: Forwards;With crutches Number of Stairs: 10 General stair comments: educated on use of crutches and sequencing to use strong LLE; good balance and use of crutches.  Wheelchair Mobility    Modified Rankin (Stroke Patients Only)       Balance             Standing balance-Leahy Scale: Fair                      Cognition Arousal/Alertness: Awake/alert Behavior During Therapy: WFL for tasks assessed/performed Overall Cognitive Status: Within Functional Limits for tasks assessed                      Exercises Other Exercises Other Exercises: At steps, with rail attempted step ups with RLE x 10 (initial partial buckle and incr UE support, progressed to lighter UE assist and no buckling); attempted stepdown using RLE in support position x 1 with significant buckling with heavy use of rail and PT on opposite side to prevent fall    General Comments        Pertinent Vitals/Pain Pain Assessment: 0-10 Pain Score: 3  Pain Location: back Pain Intervention(s):  Limited activity within patient's tolerance;Monitored during session;Repositioned    Home Living                      Prior Function            PT Goals (current goals can now be found in the care plan section) Acute Rehab PT Goals Patient Stated Goal: to get back to bowling. Time For Goal Achievement: 08/19/15 Progress towards PT goals: Progressing toward goals     Frequency  Min 5X/week    PT Plan Current plan remains appropriate;Equipment recommendations need to be updated    Co-evaluation             End of Session Equipment Utilized During Treatment: Gait belt Activity Tolerance: Patient tolerated treatment well Patient left: in chair;with call bell/phone within reach     Time: 0814-0848 PT Time Calculation (min) (ACUTE ONLY): 34 min  Charges:  $Gait Training: 23-37 mins                    G Codes:      Charles Reed 09/03/2015, 9:11 AM Pager [phone]

## 2015-08-07 NOTE — Progress Notes (Signed)
Charles Reed experienced buckling of his knees when ambulating with physical therapy yesterday. This was due to quadriceps weakness. An MRI of the lumbar spine was obtained to rule out epidural hematoma, and it showed good decompression of the thecal sac without any compressive lesions or hematoma. He complains of some numbness on the inside right calf AVSS Right quadriceps 4 minus out of 5, otherwise 5 out of 5 bilateral lower extremities Incision clean, dry, intact I spoke with Jeani Hawking with physical therapy. She says he does pretty well with crutches and could possibly go home today but she agrees that it may be better for him to have one more day with therapy He will continue PT for 1 more day and probably discharged tomorrow I'll start steroids in case there is some component of swelling to explain this phenomenon

## 2015-08-08 ENCOUNTER — Encounter (HOSPITAL_COMMUNITY): Payer: Self-pay | Admitting: Neurological Surgery

## 2015-08-08 MED ORDER — DEXAMETHASONE 1 MG PO TABS: ORAL_TABLET | ORAL | Status: DC

## 2015-08-08 NOTE — Progress Notes (Signed)
Physical Therapy Treatment Patient Details Name: Charles Reed MRN: [id number] DOB: 18-Dec-1945 Today's Date: 08/08/2015    History of Present Illness Patient is a 69 y/o male s/p Bilateral laminotomies and foraminotomies L1 to L2-3 L3-4 with decompression of the L1 L2 L3 and L4 nerve roots. 10/22 noted new onset Rt quad weakness (3/5); MD notified; MRI negative; 10/23 Rt quad weakness improved 4/5, however new numbness Rt inner lower leg PMH unremarkable.    PT Comments    Pt progressing towards physical therapy goals. Was able to improve with gait and stair training this session, requiring crutches only for stairs. Min cues required for general sequencing and safety during stair training. Pt was educated on positioning (bed, car, chair), initial walking program, and recommendations for outpatient PT when appropriate per post-op protocol. Pt and family member anticipate d/c home this afternoon. Will continue to follow.   Follow Up Recommendations  Outpatient PT;Supervision for mobility/OOB     Equipment Recommendations  Crutches    Recommendations for Other Services OT consult     Precautions / Restrictions Precautions Precautions: Back Precaution Comments: Pt was able to recall 3/3 back precautions and maintained with minimal cueing throughout session.  Restrictions Weight Bearing Restrictions: No    Mobility  Bed Mobility               General bed mobility comments: Pt was received sitting EOB.   Transfers Overall transfer level: Needs assistance Equipment used: None Transfers: Sit to/from Stand Sit to Stand: Supervision         General transfer comment: Pt demonstrated proper hand placement and safety awareness.   Ambulation/Gait Ambulation/Gait assistance: Supervision Ambulation Distance (Feet): 400 Feet Assistive device: None Gait Pattern/deviations: Step-through pattern;Decreased stride length Gait velocity: Decreased Gait velocity interpretation:  Below normal speed for age/gender General Gait Details: Slow, guarded gait. Pt states he feels more steady/stable today, and he was encouraged to attempt walking without UE support. No unsteadiness or LOB noted. As pt fatigued, he reported increased feeling of weakness in the RLE. He was cued for quad set with each step on the R for increased support and pt reports feeling of improved stability.    Stairs Stairs: Yes Stairs assistance: Min guard Stair Management: No rails;Step to pattern;Forwards;With crutches Number of Stairs: 4 General stair comments: Educated on use of crutches and sequencing to use strong LLE; good balance and use of crutches. At end of initial stair training, asked pt to perform again without cues for sequencing. Pt unable to complete with proper sequencing/safety and cues were required. Reinforced sequencing and safety with crutches at end of stair training again.  Wheelchair Mobility    Modified Rankin (Stroke Patients Only)       Balance Overall balance assessment: Needs assistance Sitting-balance support: Feet supported;No upper extremity supported Sitting balance-Leahy Scale: Normal     Standing balance support: No upper extremity supported;During functional activity Standing balance-Leahy Scale: Fair                      Cognition Arousal/Alertness: Awake/alert Behavior During Therapy: WFL for tasks assessed/performed Overall Cognitive Status: Within Functional Limits for tasks assessed                      Exercises      General Comments        Pertinent Vitals/Pain Pain Assessment: Faces Faces Pain Scale: Hurts a little bit Pain Location: back Pain Descriptors / Indicators: Operative site  guarding Pain Intervention(s): Limited activity within patient's tolerance;Monitored during session;Repositioned    Home Living                      Prior Function            PT Goals (current goals can now be found in the  care plan section) Acute Rehab PT Goals Patient Stated Goal: to get back to bowling. PT Goal Formulation: With patient Time For Goal Achievement: 08/19/15 Potential to Achieve Goals: Good Progress towards PT goals: Progressing toward goals    Frequency  Min 5X/week    PT Plan Current plan remains appropriate;Equipment recommendations need to be updated    Co-evaluation             End of Session Equipment Utilized During Treatment: Gait belt Activity Tolerance: Patient tolerated treatment well Patient left: in chair;with call bell/phone within reach;with family/visitor present     Time: 1624-4695 PT Time Calculation (min) (ACUTE ONLY): 25 min  Charges:  $Gait Training: 23-37 mins                    G Codes:      Rolinda Roan 2015/09/03, 9:53 AM   Rolinda Roan, PT, DPT Acute Rehabilitation Services Pager: [phone]

## 2015-08-08 NOTE — Progress Notes (Signed)
Patient discharged home pain controled discharge summary reviewed, IV removed, he has prescriptions from previous discharge attempt on Saturday new decadron order called to pharmacy today by MD will be with family at home not alone.

## 2015-08-08 NOTE — Care Management Important Message (Signed)
Important Message  Patient Details  Name: Charles Reed MRN: [id number] Date of Birth: 25-Dec-1945   Medicare Important Message Given:  Yes-second notification given    Delorse Lek 08/08/2015, 12:19 PM

## 2015-08-08 NOTE — Progress Notes (Signed)
Orthopedic Tech Progress Note Patient Details:  Charles Reed 1945-12-24 [id number]  Ortho Devices Type of Ortho Device: Crutches Ortho Device/Splint Interventions: Ordered, Adjustment   Braulio Bosch 08/08/2015, 5:12 PM

## 2015-08-08 NOTE — Discharge Summary (Signed)
Physician Discharge Summary  Patient ID: Charles Reed MRN: [id number] DOB/AGE: June 19, 1946 69 y.o.  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 New weakness of right quadriceps Active Problems:   Lumbar stenosis   Discharged Condition: good  Hospital Course: Patient tolerated surgery well the day after surgery was noted that his right quadriceps were we can his leg would buckle unexpectedly. The patient's discharge was held up at that point and he underwent further treatment with physical therapy and an MRI scan was performed which demonstrated that his decompression was stable. He has improved somewhat with the strength in his right quadriceps. He is discharged now  Consults: None  Significant Diagnostic Studies: Postoperative MRI lumbar spine.  Treatments: Bilateral laminotomies and foraminotomies L1 to L2-3 L3-4.  Discharge Exam: Blood pressure 134/77, pulse 72, temperature 97.7 F (36.5 C), temperature source Oral, resp. rate 16, weight 85.73 kg (189 lb), SpO2 100 %. Incision is clean and dry. Quadricep strength on right is 4 out of 5 compared to left side. Tell her reflexes absent on right. Patellar reflexes absent on left also.  Disposition: 01-Home or Self Care  Discharge Instructions    Call MD for:  redness, tenderness, or signs of infection (pain, swelling, redness, odor or green/yellow discharge around incision site)    Complete by:  As directed      Call MD for:  redness, tenderness, or signs of infection (pain, swelling, redness, odor or green/yellow discharge around incision site)    Complete by:  As directed      Call MD for:  severe uncontrolled pain    Complete by:  As directed      Call MD for:  severe uncontrolled pain    Complete by:  As directed     Call MD for:  temperature >100.4    Complete by:  As directed      Call MD for:  temperature >100.4    Complete by:  As directed      Diet - low sodium heart healthy    Complete by:  As directed      Diet - low sodium heart healthy    Complete by:  As directed      Discharge instructions    Complete by:  As directed   Okay to shower. Do not apply salves or appointments to incision. No heavy lifting with the upper extremities greater than 15 pounds. May resume driving when not requiring pain medication and patient feels comfortable with doing so.     Discharge instructions    Complete by:  As directed   Okay to shower. Do not apply salves or appointments to incision. No heavy lifting with the upper extremities greater than 15 pounds. May resume driving when not requiring pain medication and patient feels comfortable with doing so.     Increase activity slowly    Complete by:  As directed      Increase activity slowly    Complete by:  As directed             Medication List    TAKE these medications        clindamycin 150 MG capsule  Commonly known as:  CLEOCIN  Take by mouth 3 (three) times daily.     dexamethasone 1 MG tablet  Commonly known as:  DECADRON  2 tablets twice daily for 2 days, one tablet twice daily for 2 days, one tablet daily for 2 days.     Fish Oil 1200 MG Caps  Take 1 capsule by mouth daily.     HYDROcodone-acetaminophen 5-325 MG tablet  Commonly known as:  NORCO/VICODIN  Take 1-2 tablets by mouth every 6 (six) hours as needed for moderate pain.     ibuprofen 200 MG tablet  Commonly known as:  ADVIL,MOTRIN  Take 400 mg by mouth every 6 (six) hours as needed (for pain).     methocarbamol 500 MG tablet  Commonly known as:  ROBAXIN  Take 1 tablet (500 mg total) by mouth every 6 (six) hours as needed for muscle spasms.     oxyCODONE-acetaminophen 5-325 MG tablet  Commonly known as:  PERCOCET/ROXICET  Take 1-2 tablets by mouth every 4 (four) hours as  needed for moderate pain.         SignedEarleen Newport 08/08/2015, 3:38 PM

## 2015-08-09 NOTE — Anesthesia Postprocedure Evaluation (Signed)
  Anesthesia Post-op Note  Patient: Charles Reed  Procedure(s) Performed: Procedure(s) (LRB): Laminectomy and Foraminotomy - Lumbar One-Two,Lumbar Two-Three,Lumbar Three-Four (N/A)  Patient Location: PACU  Anesthesia Type: General  Level of Consciousness: awake and alert   Airway and Oxygen Therapy: Patient Spontanous Breathing  Post-op Pain: mild  Post-op Assessment: Post-op Vital signs reviewed, Patient's Cardiovascular Status Stable, Respiratory Function Stable, Patent Airway and No signs of Nausea or vomiting  Last Vitals:  Filed Vitals:   08/08/15 1347  BP: 134/77  Pulse: 72  Temp: 36.5 C  Resp: 16    Post-op Vital Signs: stable   Complications: No apparent anesthesia complications

## 2015-09-17 ENCOUNTER — Emergency Department (HOSPITAL_COMMUNITY): Payer: Medicare Other

## 2015-09-17 ENCOUNTER — Encounter (HOSPITAL_COMMUNITY): Payer: Self-pay | Admitting: Emergency Medicine

## 2015-09-17 ENCOUNTER — Emergency Department (HOSPITAL_COMMUNITY)
Admission: EM | Admit: 2015-09-17 | Discharge: 2015-09-18 | Disposition: A | Discharge status: Home / Self Care | Payer: Medicare Other | Attending: Emergency Medicine | Admitting: Emergency Medicine

## 2015-09-17 DIAGNOSIS — Z8619 Personal history of other infectious and parasitic diseases: Secondary | ICD-10-CM | POA: Diagnosis not present

## 2015-09-17 DIAGNOSIS — Z87891 Personal history of nicotine dependence: Secondary | ICD-10-CM | POA: Diagnosis not present

## 2015-09-17 DIAGNOSIS — R9431 Abnormal electrocardiogram [ECG] [EKG]: Secondary | ICD-10-CM | POA: Diagnosis not present

## 2015-09-17 DIAGNOSIS — H5500 Unspecified nystagmus: Secondary | ICD-10-CM | POA: Diagnosis not present

## 2015-09-17 DIAGNOSIS — M1990 Unspecified osteoarthritis, unspecified site: Secondary | ICD-10-CM | POA: Insufficient documentation

## 2015-09-17 DIAGNOSIS — M542 Cervicalgia: Secondary | ICD-10-CM | POA: Diagnosis not present

## 2015-09-17 DIAGNOSIS — R51 Headache: Secondary | ICD-10-CM | POA: Insufficient documentation

## 2015-09-17 DIAGNOSIS — R42 Dizziness and giddiness: Secondary | ICD-10-CM | POA: Diagnosis not present

## 2015-09-17 DIAGNOSIS — H538 Other visual disturbances: Secondary | ICD-10-CM | POA: Diagnosis not present

## 2015-09-17 HISTORY — DX: Dizziness and giddiness: R42

## 2015-09-17 LAB — CBC
HCT: 44.7 % (ref 39.0–52.0)
HEMOGLOBIN: 14.8 g/dL (ref 13.0–17.0)
MCH: 29.8 pg (ref 26.0–34.0)
MCHC: 33.1 g/dL (ref 30.0–36.0)
MCV: 89.9 fL (ref 78.0–100.0)
PLATELETS: 174 10*3/uL (ref 150–400)
RBC: 4.97 MIL/uL (ref 4.22–5.81)
RDW: 15.7 % — ABNORMAL HIGH (ref 11.5–15.5)
WBC: 5.1 10*3/uL (ref 4.0–10.5)

## 2015-09-17 LAB — BASIC METABOLIC PANEL
Anion gap: 10 (ref 5–15)
BUN: 12 mg/dL (ref 6–20)
CHLORIDE: 104 mmol/L (ref 101–111)
CO2: 26 mmol/L (ref 22–32)
CREATININE: 1.02 mg/dL (ref 0.61–1.24)
Calcium: 9.2 mg/dL (ref 8.9–10.3)
GFR calc non Af Amer: >60 mL/min (ref >60)
Glucose, Bld: 89 mg/dL (ref 65–99)
Potassium: 3.4 mmol/L — ABNORMAL LOW (ref 3.5–5.1)
Sodium: 140 mmol/L (ref 135–145)

## 2015-09-17 LAB — URINALYSIS, ROUTINE W REFLEX MICROSCOPIC
Bilirubin Urine: NEGATIVE
GLUCOSE, UA: NEGATIVE mg/dL
HGB URINE DIPSTICK: NEGATIVE
Ketones, ur: NEGATIVE mg/dL
Leukocytes, UA: NEGATIVE
Nitrite: NEGATIVE
PH: 7.5 (ref 5.0–8.0)
Protein, ur: NEGATIVE mg/dL
SPECIFIC GRAVITY, URINE: 1.02 (ref 1.005–1.030)

## 2015-09-17 LAB — CBG MONITORING, ED: Glucose-Capillary: 95 mg/dL (ref 65–99)

## 2015-09-17 LAB — I-STAT TROPONIN, ED: Troponin i, poc: 0 ng/mL (ref 0.00–0.08)

## 2015-09-17 MED ORDER — MECLIZINE HCL 25 MG PO TABS
25.0000 mg | ORAL_TABLET | Freq: Once | ORAL | Status: AC
  Administered 2015-09-17: 25 mg via ORAL
  Filled 2015-09-17: qty 1

## 2015-09-17 MED ORDER — PROMETHAZINE HCL 25 MG/ML IJ SOLN: 12.5000 mg | Freq: Once | INTRAMUSCULAR | Status: DC

## 2015-09-17 MED ORDER — LORAZEPAM 2 MG/ML IJ SOLN
1.0000 mg | Freq: Once | INTRAMUSCULAR | Status: AC
  Administered 2015-09-17: 1 mg via INTRAVENOUS
  Filled 2015-09-17: qty 1

## 2015-09-17 MED ORDER — HYDROMORPHONE HCL 1 MG/ML IJ SOLN: 1.0000 mg | Freq: Once | INTRAMUSCULAR | Status: DC

## 2015-09-17 NOTE — ED Notes (Signed)
Per pt, states positional dizziness-has been diagnosed with vertigo-states left sided neck pain

## 2015-09-17 NOTE — ED Notes (Signed)
Pt. Is unable to ambulate due to dizziness.

## 2015-09-17 NOTE — ED Provider Notes (Signed)
CSN: [id number]     Arrival date & time 09/17/15  1447 History   First MD Initiated Contact with Patient 09/17/15 1647     Chief Complaint  Patient presents with  . Dizziness  . Neck Pain     (Consider location/radiation/quality/duration/timing/severity/associated sxs/prior Treatment) Patient is a 69 y.o. Reed presenting with dizziness and neck pain. The history is provided by the patient.  Dizziness Quality:  Room spinning, vertigo, lightheadedness and imbalance Severity:  Moderate Onset quality:  Sudden (Began Monday morning when he rolled over in bed ) Duration:  5 days Timing:  Intermittent (Intermittent episodes that last approximately 30 seconds) Progression:  Unchanged Chronicity:  New Context: bending over and head movement   Context: not with ear pain, not with eye movement, not with loss of consciousness, not with medication and not when urinating   Relieved by:  Being still and lying down Worsened by:  Movement, turning head and standing up Ineffective treatments:  Medication (Tylenol sinus and headache) Associated symptoms: headaches (mild) and vision changes (blurry)   Associated symptoms: no blood in stool, no chest pain, no diarrhea, no nausea, no palpitations, no shortness of breath, no syncope, no vomiting and no weakness   Risk factors: hx of vertigo   Risk factors: no heart disease, no hx of stroke and no new medications   Neck Pain Associated symptoms: headaches (mild) and visual change (blurry)   Associated symptoms: no chest pain, no fever, no numbness, no syncope and no weakness     No injury or trauma.  Hx of vertigo.  Past Medical History  Diagnosis Date  . Arthritis     lumbar stenosis   . Hepatitis C antibody test positive     pt. use to give blood, but 20 yrs. ago turned down because of finding Hepatitis (?C) antibody in blood   . Vertigo    Past Surgical History  Procedure Laterality Date  . Lumbar laminectomy/decompression microdiscectomy N/A  08/05/2015    Procedure: Laminectomy and Foraminotomy - Lumbar One-Two,Lumbar Two-Three,Lumbar Three-Four;  Surgeon: Kristeen Miss, MD;  Location: [facility];  Service: Neurosurgery;  Laterality: N/A;   No family history on file. Social History  Substance Use Topics  . Smoking status: Former Smoker    Quit date: 10/15/1984  . Smokeless tobacco: None  . Alcohol Use: 4.2 oz/week    7 Cans of beer per week     Comment: Socially, /2-3 times per week but has in the past 2 months (8&9 -2016)- none     Review of Systems  Constitutional: Negative for fever and chills.  HENT: Negative for ear pain.   Eyes: Positive for visual disturbance (blurry vision).  Respiratory: Negative for cough, shortness of breath and wheezing.   Cardiovascular: Negative for chest pain, palpitations and syncope.  Gastrointestinal: Negative for nausea, vomiting, abdominal pain, diarrhea, constipation and blood in stool.  Genitourinary: Negative for urgency, frequency and hematuria.  Musculoskeletal: Positive for neck pain. Negative for gait problem.  Neurological: Positive for dizziness, light-headedness and headaches (mild). Negative for syncope, weakness and numbness.  All other systems reviewed and are negative.     Allergies  Review of patient's allergies indicates no known allergies.  Home Medications   Prior to Admission medications   Medication Sig Start Date End Date Taking? Authorizing Provider  HYDROcodone-acetaminophen (NORCO/VICODIN) 5-325 MG tablet Take 1-2 tablets by mouth every 6 (six) hours as needed for moderate pain.   Yes Historical Provider, MD  ibuprofen (ADVIL,MOTRIN) 200 MG  tablet Take 400 mg by mouth every 6 (six) hours as needed (for pain).   Yes Historical Provider, MD  Omega-3 Fatty Acids (FISH OIL) 1200 MG CAPS Take 1 capsule by mouth 4 (four) times a week.    Yes Historical Provider, MD  pseudoephedrine-acetaminophen (TYLENOL SINUS) 30-500 MG TABS tablet Take 1-2 tablets by mouth  every 4 (four) hours as needed (pain).   Yes Historical Provider, MD  dexamethasone (DECADRON) 1 MG tablet 2 tablets twice daily for 2 days, one tablet twice daily for 2 days, one tablet daily for 2 days. Patient not taking: Reported on 09/17/2015 08/08/15   Kristeen Miss, MD  dexamethasone (DECADRON) 1 MG tablet 2 tablets twice daily for 2 days, one tablet twice daily for 2 days, one tablet daily for 2 days. Patient not taking: Reported on 09/17/2015 08/08/15   Kristeen Miss, MD  LORazepam (ATIVAN) 1 MG tablet Take 1 tablet (1 mg total) by mouth every 6 (six) hours as needed (dizziness or vertigo). 09/18/15   Gloriann Loan, PA-C  methocarbamol (ROBAXIN) 500 MG tablet Take 1 tablet (500 mg total) by mouth every 6 (six) hours as needed for muscle spasms. Patient not taking: Reported on 09/17/2015 08/06/15   Kristeen Miss, MD  oxyCODONE-acetaminophen (PERCOCET/ROXICET) 5-325 MG tablet Take 1-2 tablets by mouth every 4 (four) hours as needed for moderate pain. Patient not taking: Reported on 09/17/2015 08/06/15   Kristeen Miss, MD   BP 146/94 mmHg  Pulse 70  Temp(Src) 97.6 F (36.4 C) (Oral)  Resp 16  SpO2 96% Physical Exam  Constitutional: He is oriented to person, place, and time. He appears well-developed and well-nourished.  HENT:  Head: Normocephalic and atraumatic.  Mouth/Throat: Oropharynx is clear and moist.  Eyes: Conjunctivae and EOM are normal. Pupils are equal, round, and reactive to light.  Horizontal nystagmus present.  Neck: Normal range of motion. Neck supple. Normal carotid pulses present. Muscular tenderness (bilaterally) present. Carotid bruit is not present. Normal range of motion present.    No carotid tenderness.  Neck TTP b/l along paraspinal muscles and trapezius.  No midline tenderness.   Cardiovascular: Normal rate, regular rhythm and normal heart sounds.   No murmur heard. Pulmonary/Chest: Effort normal and breath sounds normal. No accessory muscle usage or stridor. No  respiratory distress. He has no wheezes. He has no rhonchi. He has no rales.  Abdominal: Soft. Bowel sounds are normal. He exhibits no distension. There is no tenderness.  Musculoskeletal: Normal range of motion.       Cervical back: He exhibits tenderness and pain. He exhibits normal range of motion, no bony tenderness, no swelling, no edema, no deformity, no laceration and no spasm.  Lymphadenopathy:    He has no cervical adenopathy.  Neurological: He is alert and oriented to person, place, and time.  Mental Status:   AOx3.  Speech clear without dysarthria. Cranial Nerves:  I-not tested  II-PERRLA  III, IV, VI-EOMs intact  V-temporal and masseter strength intact  VII-symmetrical facial movements intact, no facial droop  VIII-hearing grossly intact bilaterally  IX, X-gag intact  XI-strength of sternomastoid and trapezius muscles 5/5  XII-tongue midline Motor:   Good muscle bulk and tone  Strength 5/5 bilaterally in upper and lower extremities   Cerebellar--RAMs, finger to nose intact  Romberg--maintains balance with eyes closed  Casual and tandem gait normal without ataxia  No pronator drift Sensory:  Intact in upper and lower extremities   Skin: Skin is warm and dry.  Psychiatric: He  has a normal mood and affect. His behavior is normal.    ED Course  Procedures (including critical care time) Labs Review Labs Reviewed  BASIC METABOLIC PANEL - Abnormal; Notable for the following:    Potassium 3.4 (*)    All other components within normal limits  CBC - Abnormal; Notable for the following:    RDW 15.7 (*)    All other components within normal limits  URINALYSIS, ROUTINE W REFLEX MICROSCOPIC (NOT AT [facility])  CBG MONITORING, ED  Randolm Idol, ED    Imaging Review Ct Head Wo Contrast  09/17/2015  CLINICAL DATA:  69 year old Reed with positional dizziness. EXAM: CT HEAD WITHOUT CONTRAST TECHNIQUE: Contiguous axial images were obtained from the base of the skull through  the vertex without intravenous contrast. COMPARISON:  None. FINDINGS: There is slight prominence of the ventricles and sulci compatible with age-related volume loss. Mild periventricular and deep white matter hypodensities represent chronic microvascular ischemic changes. There is no intracranial hemorrhage. No mass effect or midline shift identified. Mild mucoperiosteal thickening of the maxillary sinuses. Remainder of the paranasal sinuses and mastoid air cells are well aerated. The calvarium is intact. IMPRESSION: No acute intracranial pathology. Mild age-related atrophy and chronic microvascular ischemic disease. If symptoms persist and there are no contraindications, MRI may provide better evaluation if clinically indicated Electronically Signed   By: Anner Crete M.D.   On: 09/17/2015 21:01   Mr Brain Wo Contrast (neuro Protocol)  09/17/2015  CLINICAL DATA:  Unable to ambulate due to dizziness for 6 days. Previous diagnosis of vertigo. EXAM: MRI HEAD WITHOUT CONTRAST TECHNIQUE: Multiplanar, multiecho pulse sequences of the brain and surrounding structures were obtained without intravenous contrast. COMPARISON:  CT head September 17, 2015 at 2047 hours FINDINGS: The ventricles and sulci are normal for patient's age. No abnormal parenchymal signal, mass lesions, mass effect. No reduced diffusion to suggest acute ischemia. No susceptibility artifact to suggest hemorrhage. No abnormal extra-axial fluid collections. No extra-axial masses though, contrast enhanced sequences would be more sensitive. Normal major intracranial vascular flow voids seen at the skull base. Ocular globes and orbital contents are unremarkable though not tailored for evaluation. No abnormal sellar expansion. Mild paranasal sinus mucosal thickening without air-fluid levels. Small bilateral maxillary mucosal retention cyst. The mastoid air cells are well aerated. No suspicious calvarial bone marrow signal. Craniocervical junction  maintained. IMPRESSION: Normal noncontrast MRI of the brain for age. Electronically Signed   By: Elon Alas M.D.   On: 09/17/2015 23:43   I have personally reviewed and evaluated these images and lab results as part of my medical decision-making.   EKG Interpretation   Date/Time:  Saturday September 17 2015 15:28:14 EST Ventricular Rate:  Charles PR Interval:  143 QRS Duration: 86 QT Interval:  368 QTC Calculation: 394 R Axis:   -43 Text Interpretation:  Sinus rhythm Probable left atrial enlargement Left  anterior fascicular block Low voltage, precordial leads Posterior infarct,  old Baseline wander in lead(s) V3 No significant change since last tracing  Confirmed by NGUYEN, EMILY ([id number]) on 09/17/2015 7:28:45 PM      MDM   Final diagnoses:  Dizziness    Patient presents with intermittent episodes of dizziness that feel like the room is spinning.  Similar to his previous vertigo.  VS show HTN.  On exam, FAROM of neck.  TTP b/l along trapezius.  On exam, EOMs intact with nystagmus.  Heart RRR, lungs CTAB, abdomen soft and nontender.  Suspect peripheral etiology of vertigo.  Troponin 0.0. EKG shows NSR. BMP, CBC unremarkable. UA unremarkable  Patient unable to ambulate due to dizziness.  Will obtain CT head without contrast to evaluate for stroke. CT head shows no acute intracranial pathology.  Will obtain MRI.  Brain MRI normal. Doubt stroke or other intracranial pathology.  Suspect peripheral etiology.  Evaluation does not show pathology requring ongoing emergent intervention or admission. Pt is hemodynamically stable and mentating appropriately. Discussed findings/results and plan with patient/guardian, who agrees with plan. All questions answered. Return precautions discussed and outpatient follow up given.   Case has been discussed with and seen by Dr. Alfonse Spruce who agrees with the above plan for discharge.    Gloriann Loan, PA-C 09/18/15 0038  Harvel Quale,  MD 09/18/15 [phone]

## 2015-09-18 MED ORDER — LORAZEPAM 1 MG PO TABS: 1.0000 mg | ORAL_TABLET | Freq: Four times a day (QID) | ORAL | Status: DC | PRN

## 2015-09-18 NOTE — Discharge Instructions (Signed)
Benign Positional Vertigo °Vertigo is the feeling that you or your surroundings are moving when they are not. Benign positional vertigo is the most common form of vertigo. The cause of this condition is not serious (is benign). This condition is triggered by certain movements and positions (is positional). This condition can be dangerous if it occurs while you are doing something that could endanger you or others, such as driving.  °CAUSES °In many cases, the cause of this condition is not known. It may be caused by a disturbance in an area of the inner ear that helps your brain to sense movement and balance. This disturbance can be caused by a viral infection (labyrinthitis), head injury, or repetitive motion. °RISK FACTORS °This condition is more likely to develop in: °· Women. °· People who are 50 years of age or older. °SYMPTOMS °Symptoms of this condition usually happen when you move your head or your eyes in different directions. Symptoms may start suddenly, and they usually last for less than a minute. Symptoms may include: °· Loss of balance and falling. °· Feeling like you are spinning or moving. °· Feeling like your surroundings are spinning or moving. °· Nausea and vomiting. °· Blurred vision. °· Dizziness. °· Involuntary eye movement (nystagmus). °Symptoms can be mild and cause only slight annoyance, or they can be severe and interfere with daily life. Episodes of benign positional vertigo may return (recur) over time, and they may be triggered by certain movements. Symptoms may improve over time. °DIAGNOSIS °This condition is usually diagnosed by medical history and a physical exam of the head, neck, and ears. You may be referred to a health care provider who specializes in ear, nose, and throat (ENT) problems (otolaryngologist) or a provider who specializes in disorders of the nervous system (neurologist). You may have additional testing, including: °· MRI. °· A CT scan. °· Eye movement tests. Your  health care provider may ask you to change positions quickly while he or she watches you for symptoms of benign positional vertigo, such as nystagmus. Eye movement may be tested with an electronystagmogram (ENG), caloric stimulation, the Dix-Hallpike test, or the roll test. °· An electroencephalogram (EEG). This records electrical activity in your brain. °· Hearing tests. °TREATMENT °Usually, your health care provider will treat this by moving your head in specific positions to adjust your inner ear back to normal. Surgery may be needed in severe cases, but this is rare. In some cases, benign positional vertigo may resolve on its own in 2-4 weeks. °HOME CARE INSTRUCTIONS °Safety °· Move slowly. Avoid sudden body or head movements. °· Avoid driving. °· Avoid operating heavy machinery. °· Avoid doing any tasks that would be dangerous to you or others if a vertigo episode would occur. °· If you have trouble walking or keeping your balance, try using a cane for stability. If you feel dizzy or unstable, sit down right away. °· Return to your normal activities as told by your health care provider. Ask your health care provider what activities are safe for you. °General Instructions °· Take over-the-counter and prescription medicines only as told by your health care provider. °· Avoid certain positions or movements as told by your health care provider. °· Drink enough fluid to keep your urine clear or pale yellow. °· Keep all follow-up visits as told by your health care provider. This is important. °SEEK MEDICAL CARE IF: °· You have a fever. °· Your condition gets worse or you develop new symptoms. °· Your family or friends   notice any behavioral changes. °· Your nausea or vomiting gets worse. °· You have numbness or a "pins and needles" sensation. °SEEK IMMEDIATE MEDICAL CARE IF: °· You have difficulty speaking or moving. °· You are always dizzy. °· You faint. °· You develop severe headaches. °· You have weakness in your  legs or arms. °· You have changes in your hearing or vision. °· You develop a stiff neck. °· You develop sensitivity to light. °  °This information is not intended to replace advice given to you by your health care provider. Make sure you discuss any questions you have with your health care provider. °  °Document Released: 07/09/2006 Document Revised: 06/22/2015 Document Reviewed: 01/24/2015 °Elsevier Interactive Patient Education ©2016 [employer] ° °Dizziness °Dizziness is a common problem. It is a feeling of unsteadiness or light-headedness. You may feel like you are about to faint. Dizziness can lead to injury if you stumble or fall. Anyone can become dizzy, but dizziness is more common in older adults. This condition can be caused by a number of things, including medicines, dehydration, or illness. °HOME CARE INSTRUCTIONS °Taking these steps may help with your condition: °Eating and Drinking °· Drink enough fluid to keep your urine clear or pale yellow. This helps to keep you from becoming dehydrated. Try to drink more clear fluids, such as water. °· Do not drink alcohol. °· Limit your caffeine intake if directed by your health care provider. °· Limit your salt intake if directed by your health care provider. °Activity °· Avoid making quick movements. °¨ Rise slowly from chairs and steady yourself until you feel okay. °¨ In the morning, first sit up on the side of the bed. When you feel okay, stand slowly while you hold onto something until you know that your balance is fine. °· Move your legs often if you need to stand in one place for a long time. Tighten and relax your muscles in your legs while you are standing. °· Do not drive or operate heavy machinery if you feel dizzy. °· Avoid bending down if you feel dizzy. Place items in your home so that they are easy for you to reach without leaning over. °Lifestyle °· Do not use any tobacco products, including cigarettes, chewing tobacco, or electronic  cigarettes. If you need help quitting, ask your health care provider. °· Try to reduce your stress level, such as with yoga or meditation. Talk with your health care provider if you need help. °General Instructions °· Watch your dizziness for any changes. °· Take medicines only as directed by your health care provider. Talk with your health care provider if you think that your dizziness is caused by a medicine that you are taking. °· Tell a friend or a family member that you are feeling dizzy. If he or she notices any changes in your behavior, have this person call your health care provider. °· Keep all follow-up visits as directed by your health care provider. This is important. °SEEK MEDICAL CARE IF: °· Your dizziness does not go away. °· Your dizziness or light-headedness gets worse. °· You feel nauseous. °· You have reduced hearing. °· You have new symptoms. °· You are unsteady on your feet or you feel like the room is spinning. °SEEK IMMEDIATE MEDICAL CARE IF: °· You vomit or have diarrhea and are unable to eat or drink anything. °· You have problems talking, walking, swallowing, or using your arms, hands, or legs. °· You feel generally weak. °· You are not   thinking clearly or you have trouble forming sentences. It may take a friend or family member to notice this. °· You have chest pain, abdominal pain, shortness of breath, or sweating. °· Your vision changes. °· You notice any bleeding. °· You have a headache. °· You have neck pain or a stiff neck. °· You have a fever. °  °This information is not intended to replace advice given to you by your health care provider. Make sure you discuss any questions you have with your health care provider. °  °Document Released: 03/27/2001 Document Revised: 02/15/2015 Document Reviewed: 09/27/2014 °Elsevier Interactive Patient Education ©2016 [employer]vier Inc. ° °

## 2015-09-21 DIAGNOSIS — H8110 Benign paroxysmal vertigo, unspecified ear: Secondary | ICD-10-CM | POA: Diagnosis not present

## 2015-09-21 DIAGNOSIS — R0981 Nasal congestion: Secondary | ICD-10-CM | POA: Diagnosis not present

## 2015-12-20 DIAGNOSIS — H8110 Benign paroxysmal vertigo, unspecified ear: Secondary | ICD-10-CM | POA: Diagnosis not present

## 2015-12-20 DIAGNOSIS — E7800 Pure hypercholesterolemia, unspecified: Secondary | ICD-10-CM | POA: Diagnosis not present

## 2015-12-20 DIAGNOSIS — M5136 Other intervertebral disc degeneration, lumbar region: Secondary | ICD-10-CM | POA: Diagnosis not present

## 2015-12-20 DIAGNOSIS — Z1389 Encounter for screening for other disorder: Secondary | ICD-10-CM | POA: Diagnosis not present

## 2015-12-20 DIAGNOSIS — N400 Enlarged prostate without lower urinary tract symptoms: Secondary | ICD-10-CM | POA: Diagnosis not present

## 2015-12-20 DIAGNOSIS — Z0000 Encounter for general adult medical examination without abnormal findings: Secondary | ICD-10-CM | POA: Diagnosis not present

## 2015-12-20 DIAGNOSIS — Z23 Encounter for immunization: Secondary | ICD-10-CM | POA: Diagnosis not present

## 2015-12-22 ENCOUNTER — Other Ambulatory Visit: Payer: Self-pay | Admitting: Gastroenterology

## 2016-01-11 ENCOUNTER — Encounter (HOSPITAL_COMMUNITY): Payer: Self-pay | Admitting: *Deleted

## 2016-01-23 ENCOUNTER — Ambulatory Visit (HOSPITAL_COMMUNITY)
Admission: RE | Admit: 2016-01-23 | Discharge: 2016-01-23 | Disposition: A | Discharge status: Home / Self Care | Payer: Medicare Other | Source: Ambulatory Visit | Attending: Gastroenterology | Admitting: Gastroenterology

## 2016-01-23 ENCOUNTER — Encounter (HOSPITAL_COMMUNITY)
Admission: RE | Disposition: A | Discharge status: Home / Self Care | Payer: Self-pay | Source: Ambulatory Visit | Attending: Gastroenterology

## 2016-01-23 ENCOUNTER — Encounter (HOSPITAL_COMMUNITY): Payer: Self-pay

## 2016-01-23 ENCOUNTER — Ambulatory Visit (HOSPITAL_COMMUNITY): Payer: Medicare Other | Admitting: Certified Registered Nurse Anesthetist

## 2016-01-23 DIAGNOSIS — Z87891 Personal history of nicotine dependence: Secondary | ICD-10-CM | POA: Diagnosis not present

## 2016-01-23 DIAGNOSIS — Z1211 Encounter for screening for malignant neoplasm of colon: Secondary | ICD-10-CM | POA: Diagnosis not present

## 2016-01-23 DIAGNOSIS — D125 Benign neoplasm of sigmoid colon: Secondary | ICD-10-CM | POA: Insufficient documentation

## 2016-01-23 DIAGNOSIS — B1920 Unspecified viral hepatitis C without hepatic coma: Secondary | ICD-10-CM | POA: Insufficient documentation

## 2016-01-23 DIAGNOSIS — K635 Polyp of colon: Secondary | ICD-10-CM | POA: Diagnosis not present

## 2016-01-23 HISTORY — DX: Allergy status to unspecified drugs, medicaments and biological substances: Z88.9

## 2016-01-23 HISTORY — PX: COLONOSCOPY WITH PROPOFOL: SHX5780

## 2016-01-23 SURGERY — COLONOSCOPY WITH PROPOFOL
Anesthesia: Monitor Anesthesia Care

## 2016-01-23 MED ORDER — LIDOCAINE HCL (CARDIAC) 20 MG/ML IV SOLN
INTRAVENOUS | Status: DC | PRN
  Administered 2016-01-23: 100 mg via INTRAVENOUS

## 2016-01-23 MED ORDER — ONDANSETRON HCL 4 MG/2ML IJ SOLN
INTRAMUSCULAR | Status: AC
Start: 2016-01-23 — End: 2016-01-23
  Filled 2016-01-23: qty 2

## 2016-01-23 MED ORDER — SODIUM CHLORIDE 0.9 % IV SOLN: INTRAVENOUS | Status: DC

## 2016-01-23 MED ORDER — PROPOFOL 10 MG/ML IV BOLUS
INTRAVENOUS | Status: AC
  Filled 2016-01-23: qty 60

## 2016-01-23 MED ORDER — LACTATED RINGERS IV SOLN
INTRAVENOUS | Status: DC | PRN
  Administered 2016-01-23: 13:00:00 via INTRAVENOUS

## 2016-01-23 MED ORDER — ONDANSETRON HCL 4 MG/2ML IJ SOLN
INTRAMUSCULAR | Status: DC | PRN
  Administered 2016-01-23: 4 mg via INTRAVENOUS

## 2016-01-23 MED ORDER — PROPOFOL 10 MG/ML IV BOLUS
INTRAVENOUS | Status: DC | PRN
  Administered 2016-01-23: 20 mg via INTRAVENOUS
  Administered 2016-01-23: 30 mg via INTRAVENOUS
  Administered 2016-01-23: 10 mg via INTRAVENOUS
  Administered 2016-01-23: 20 mg via INTRAVENOUS

## 2016-01-23 MED ORDER — PROPOFOL 500 MG/50ML IV EMUL
INTRAVENOUS | Status: DC | PRN
  Administered 2016-01-23: 75 ug/kg/min via INTRAVENOUS

## 2016-01-23 SURGICAL SUPPLY — 21 items

## 2016-01-23 NOTE — Op Note (Signed)
[facility] Patient Name: Charles Reed Procedure Date: 01/23/2016 MRN: [id number] Attending MD: Garlan Fair , MD Date of Birth: 19-Nov-1945 CSN:  Age: 70 Admit Type: Outpatient Procedure:                Colonoscopy Indications:              Screening for colorectal malignant neoplasm Providers:                Garlan Fair, MD, Laverta Baltimore, RN, Corliss Parish, Technician Referring MD:              Medicines:                Propofol per Anesthesia Complications:            No immediate complications. Estimated Blood Loss:     Estimated blood loss: none. Procedure:                Pre-Anesthesia Assessment:                           - Prior to the procedure, a History and Physical                            was performed, and patient medications and                            allergies were reviewed. The patient's tolerance of                            previous anesthesia was also reviewed. The risks                            and benefits of the procedure and the sedation                            options and risks were discussed with the patient.                            All questions were answered, and informed consent                            was obtained. Prior Anticoagulants: The patient has                            taken no previous anticoagulant or antiplatelet                            agents. ASA Grade Assessment: II - A patient with                            mild systemic disease. After reviewing the risks  and benefits, the patient was deemed in                            satisfactory condition to undergo the procedure.                           After obtaining informed consent, the colonoscope                            was passed under direct vision. Throughout the                            procedure, the patient's blood pressure, pulse, and                            oxygen  saturations were monitored continuously. The                            [id number] scope was introduced through                            the anus and advanced to the the cecum, identified                            by appendiceal orifice and ileocecal valve. The                            colonoscopy was performed without difficulty. The                            patient tolerated the procedure well. The quality                            of the bowel preparation was good. The appendiceal                            orifice and the rectum were photographed. Scope In: 1:31:19 PM Scope Out: 1:50:33 PM Scope Withdrawal Time: 0 hours 9 minutes 55 seconds  Total Procedure Duration: 0 hours 19 minutes 14 seconds  Findings:      The perianal and digital rectal examinations were normal.      A 4 mm polyp was found in the sigmoid colon. The polyp was sessile. The       polyp was removed with a cold snare. Resection and retrieval were       complete.      The exam was otherwise without abnormality. Impression:               - One 4 mm polyp in the sigmoid colon, removed with                            a cold snare. Resected and retrieved.                           - The examination was otherwise normal.  Moderate Sedation:      N/A- Per Anesthesia Care Recommendation:           - Patient has a contact number available for                            emergencies. The signs and symptoms of potential                            delayed complications were discussed with the                            patient. Return to normal activities tomorrow.                            Written discharge instructions were provided to the                            patient.                           - Repeat colonoscopy date to be determined after                            pending pathology results are reviewed for                            surveillance.                           - Resume previous  diet.                           - Continue present medications. Procedure Code(s):        --- Professional ---                           [id number], Colonoscopy, flexible; with removal of                            tumor(s), polyp(s), or other lesion(s) by snare                            technique Diagnosis Code(s):        --- Professional ---                           Z12.11, Encounter for screening for malignant                            neoplasm of colon                           D12.5, Benign neoplasm of sigmoid colon CPT copyright 2016 [employer]. All rights reserved. The codes documented in this report are preliminary and upon coder review may  be revised to meet current compliance requirements. Earle Gell, MD Garlan Fair, MD 01/23/2016 1:56:38 PM  This report has been signed electronically. Number of Addenda: 0

## 2016-01-23 NOTE — Discharge Instructions (Signed)

## 2016-01-23 NOTE — Transfer of Care (Signed)
Immediate Anesthesia Transfer of Care Note  Patient: Charles Reed  Procedure(s) Performed: Procedure(s): COLONOSCOPY WITH PROPOFOL (N/A)  Patient Location: ENDO  Anesthesia Type:MAC  Level of Consciousness:  sedated, patient cooperative and responds to stimulation  Airway & Oxygen Therapy:Patient Spontanous Breathing and Patient connected to face mask oxgen  Post-op Assessment:  Report given to ENDO RN and Post -op Vital signs reviewed and stable  Post vital signs:  Reviewed and stable  Last Vitals:  Filed Vitals:   01/23/16 1250 01/23/16 1358  BP: 165/105   Pulse: 66 63  Temp: 36.6 C   Resp: 14 17    Complications: No apparent anesthesia complications

## 2016-01-23 NOTE — Anesthesia Postprocedure Evaluation (Signed)
Anesthesia Post Note  Patient: Charles Reed  Procedure(s) Performed: Procedure(s) (LRB): COLONOSCOPY WITH PROPOFOL (N/A)  Patient location during evaluation: Endoscopy Anesthesia Type: MAC Level of consciousness: awake and alert Pain management: pain level controlled Vital Signs Assessment: post-procedure vital signs reviewed and stable Respiratory status: spontaneous breathing, nonlabored ventilation, respiratory function stable and patient connected to nasal cannula oxygen Cardiovascular status: stable and blood pressure returned to baseline Anesthetic complications: no    Last Vitals:  Filed Vitals:   01/23/16 1410 01/23/16 1420  BP: 122/75   Pulse: 62 64  Temp:    Resp: 18 15    Last Pain: There were no vitals filed for this visit.               Montez Hageman

## 2016-01-23 NOTE — H&P (Signed)
  Procedure: Baseline screening colonoscopy  History: The patient is a 70 year old male born 1945-12-17. He is scheduled to undergo his first screening colonoscopy with polypectomy to prevent colon cancer.  Medication allergies: None  Past medical history: Lumbar spinal stenosis surgery. Migraine headache syndrome. Benign paroxysmal vertigo.  Exam: The patient is alert and lying comfortably on the endoscopy stretcher. Abdomen is soft and nontender to palpation. Cardiac exam reveals a regular rhythm. Lungs are clear to auscultation.  Plan: Proceed with baseline screening colonoscopy

## 2016-01-23 NOTE — Anesthesia Preprocedure Evaluation (Signed)
Anesthesia Evaluation  Patient identified by MRN, date of birth, ID band Patient awake    Reviewed: Allergy & Precautions, NPO status , Patient's Chart, lab work & pertinent test results  Airway Mallampati: II  TM Distance: >3 FB Neck ROM: Full    Dental no notable dental hx.    Pulmonary neg pulmonary ROS, former smoker,    Pulmonary exam normal breath sounds clear to auscultation       Cardiovascular negative cardio ROS Normal cardiovascular exam Rhythm:Regular Rate:Normal     Neuro/Psych negative neurological ROS  negative psych ROS   GI/Hepatic negative GI ROS, (+) Hepatitis -, C  Endo/Other  negative endocrine ROS  Renal/GU negative Renal ROS  negative genitourinary   Musculoskeletal negative musculoskeletal ROS (+)   Abdominal   Peds negative pediatric ROS (+)  Hematology negative hematology ROS (+)   Anesthesia Other Findings   Reproductive/Obstetrics negative OB ROS                             Anesthesia Physical Anesthesia Plan  ASA: II  Anesthesia Plan: MAC   Post-op Pain Management:    Induction: Intravenous  Airway Management Planned: Simple Face Mask  Additional Equipment:   Intra-op Plan:   Post-operative Plan:   Informed Consent: I have reviewed the patients History and Physical, chart, labs and discussed the procedure including the risks, benefits and alternatives for the proposed anesthesia with the patient or authorized representative who has indicated his/her understanding and acceptance.   Dental advisory given  Plan Discussed with: CRNA  Anesthesia Plan Comments:         Anesthesia Quick Evaluation

## 2016-01-25 ENCOUNTER — Encounter (HOSPITAL_COMMUNITY): Payer: Self-pay | Admitting: Gastroenterology

## 2016-02-28 DIAGNOSIS — H16223 Keratoconjunctivitis sicca, not specified as Sjogren's, bilateral: Secondary | ICD-10-CM | POA: Diagnosis not present

## 2016-02-28 DIAGNOSIS — H2513 Age-related nuclear cataract, bilateral: Secondary | ICD-10-CM | POA: Diagnosis not present

## 2016-08-24 DIAGNOSIS — M654 Radial styloid tenosynovitis [de Quervain]: Secondary | ICD-10-CM | POA: Diagnosis not present

## 2016-08-24 DIAGNOSIS — M25532 Pain in left wrist: Secondary | ICD-10-CM | POA: Diagnosis not present

## 2016-09-21 DIAGNOSIS — M79642 Pain in left hand: Secondary | ICD-10-CM | POA: Diagnosis not present

## 2016-09-21 DIAGNOSIS — M654 Radial styloid tenosynovitis [de Quervain]: Secondary | ICD-10-CM | POA: Diagnosis not present

## 2016-11-05 DIAGNOSIS — H6982 Other specified disorders of Eustachian tube, left ear: Secondary | ICD-10-CM | POA: Diagnosis not present

## 2016-11-05 DIAGNOSIS — L84 Corns and callosities: Secondary | ICD-10-CM | POA: Diagnosis not present

## 2016-11-07 DIAGNOSIS — S8982XA Other specified injuries of left lower leg, initial encounter: Secondary | ICD-10-CM | POA: Diagnosis not present

## 2016-11-07 DIAGNOSIS — M1732 Unilateral post-traumatic osteoarthritis, left knee: Secondary | ICD-10-CM | POA: Diagnosis not present

## 2016-11-21 ENCOUNTER — Ambulatory Visit (INDEPENDENT_AMBULATORY_CARE_PROVIDER_SITE_OTHER): Payer: Medicare Other | Admitting: Podiatry

## 2016-11-21 ENCOUNTER — Encounter: Payer: Self-pay | Admitting: Podiatry

## 2016-11-21 VITALS — BP 132/72 | HR 86 | Resp 16 | Ht 68.75 in | Wt 196.0 lb

## 2016-11-21 DIAGNOSIS — L84 Corns and callosities: Secondary | ICD-10-CM | POA: Diagnosis not present

## 2016-11-21 DIAGNOSIS — M79671 Pain in right foot: Secondary | ICD-10-CM | POA: Diagnosis not present

## 2016-11-21 DIAGNOSIS — M79672 Pain in left foot: Secondary | ICD-10-CM

## 2016-11-21 DIAGNOSIS — L851 Acquired keratosis [keratoderma] palmaris et plantaris: Secondary | ICD-10-CM | POA: Diagnosis not present

## 2016-11-21 NOTE — Progress Notes (Signed)
   Subjective:    Patient ID: Charles Reed, male    DOB: 1946/09/17, 71 y.o.   MRN: [id number]  HPI    Review of Systems  HENT: Positive for hearing loss, sinus pressure and tinnitus.   All other systems reviewed and are negative.      Objective:   Physical Exam        Assessment & Plan:

## 2016-12-05 NOTE — Progress Notes (Signed)
Patient ID: Charles Reed, male   DOB: November 17, 1945, 71 y.o.   MRN: [id number]   Subjective:  Patient presents today for evaluation of a callus lesion to the fifth digit left foot which is been going on for approximately 2 months now. He denies trauma and states that it is not necessarily painful. Patient presents today for further treatment and evaluation    Objective/Physical Exam General: The patient is alert and oriented x3 in no acute distress.  Dermatology: Hyperkeratotic lesion noted to the fifth digit of the left foot approximately 1 cm in diameter. There is no open ulceration noted. Skin is warm, dry and supple bilateral lower extremities. Negative for open lesions or macerations.  Vascular: Palpable pedal pulses bilaterally. No edema or erythema noted. Capillary refill within normal limits.  Neurological: Epicritic and protective threshold grossly intact bilaterally.   Musculoskeletal Exam: Range of motion within normal limits to all pedal and ankle joints bilateral. Muscle strength 5/5 in all groups bilateral.   Radiographic Exam:  Normal osseous mineralization. Joint spaces preserved. No fracture/dislocation/boney destruction.    Assessment: #1 callus lesion fifth digit left foot   Plan of Care:  #1 Patient was evaluated. #2 excisional debridement of callus lesion was performed to the fifth digit left foot using a chisel blade without incident or bleeding #3 return to clinic in 4 weeks. Callus lesions to better may be a fungal lesion.   Edrick Kins, DPM [facility]  Dr. Edrick Kins, [address]                                        [place], [place] [address]                Office [phone]  Fax [phone]

## 2016-12-19 ENCOUNTER — Ambulatory Visit (INDEPENDENT_AMBULATORY_CARE_PROVIDER_SITE_OTHER): Payer: Medicare Other | Admitting: Podiatry

## 2016-12-19 DIAGNOSIS — L851 Acquired keratosis [keratoderma] palmaris et plantaris: Secondary | ICD-10-CM | POA: Diagnosis not present

## 2016-12-19 DIAGNOSIS — B353 Tinea pedis: Secondary | ICD-10-CM | POA: Diagnosis not present

## 2016-12-19 DIAGNOSIS — L84 Corns and callosities: Secondary | ICD-10-CM | POA: Diagnosis not present

## 2016-12-25 NOTE — Progress Notes (Signed)
Patient ID: Charles Reed, male   DOB: 1946/05/16, 71 y.o.   MRN: [id number]   Subjective:  Patient presents today for follow-up evaluation of a painful callus lesion to the fifth digit left foot. Patient also has a new complaint today of left great toe pain 1 week. Patient denies any change in daily activity other than wearing a different pair shoes approximately one week ago. Patient presents today for further treatment and evaluation    Objective/Physical Exam General: The patient is alert and oriented x3 in no acute distress.  Dermatology: Hyperkeratotic lesion noted to the fifth digit of the left foot approximately 1 cm in diameter. There is no open ulceration noted. Diffuse hyperkeratotic skin noted to the bilateral weightbearing surfaces of the feet with symptomatic pruritus consistent with tinea pedis.  Vascular: Palpable pedal pulses bilaterally. No edema or erythema noted. Capillary refill within normal limits.  Neurological: Epicritic and protective threshold grossly intact bilaterally.   Musculoskeletal Exam: Pain on palpation range of motion to the left great toe.  Assessment: #1 callus lesion fifth digit left foot #2 left great toe pain secondary to change in shoe gear #3 tinea pedis   Plan of Care:  #1 Patient was evaluated. #2 excisional debridement of callus lesion was performed to the fifth digit left foot using a chisel blade without incident or bleeding #3 continue Lotrisone cream #4 explained the patient the left great toe pain is likely due to the change in shoe gear. #5 return to clinic when necessary  Edrick Kins, DPM [facility]  Dr. Edrick Kins, [address]                                        [place], [place] [address]                Office [phone]  Fax [phone]

## 2017-01-21 DIAGNOSIS — Z0000 Encounter for general adult medical examination without abnormal findings: Secondary | ICD-10-CM | POA: Diagnosis not present

## 2017-01-21 DIAGNOSIS — D72819 Decreased white blood cell count, unspecified: Secondary | ICD-10-CM | POA: Diagnosis not present

## 2017-01-21 DIAGNOSIS — M5136 Other intervertebral disc degeneration, lumbar region: Secondary | ICD-10-CM | POA: Diagnosis not present

## 2017-01-21 DIAGNOSIS — H8110 Benign paroxysmal vertigo, unspecified ear: Secondary | ICD-10-CM | POA: Diagnosis not present

## 2017-01-21 DIAGNOSIS — J309 Allergic rhinitis, unspecified: Secondary | ICD-10-CM | POA: Diagnosis not present

## 2017-01-21 DIAGNOSIS — Z1159 Encounter for screening for other viral diseases: Secondary | ICD-10-CM | POA: Diagnosis not present

## 2017-01-21 DIAGNOSIS — N400 Enlarged prostate without lower urinary tract symptoms: Secondary | ICD-10-CM | POA: Diagnosis not present

## 2017-01-21 DIAGNOSIS — Z1389 Encounter for screening for other disorder: Secondary | ICD-10-CM | POA: Diagnosis not present

## 2017-01-21 DIAGNOSIS — D696 Thrombocytopenia, unspecified: Secondary | ICD-10-CM | POA: Diagnosis not present

## 2017-01-21 DIAGNOSIS — E7800 Pure hypercholesterolemia, unspecified: Secondary | ICD-10-CM | POA: Diagnosis not present

## 2017-01-21 DIAGNOSIS — Z125 Encounter for screening for malignant neoplasm of prostate: Secondary | ICD-10-CM | POA: Diagnosis not present

## 2017-02-07 IMAGING — CR DG ORBITS FOR FOREIGN BODY
2 series · 2 of 2 positions shown · non-contrast
Comparison: None.

CLINICAL DATA: Metal working/exposure; clearance prior to MRI

EXAM:
ORBITS FOR FOREIGN BODY - 2 VIEW

[w orbit pa (1 of 2)]
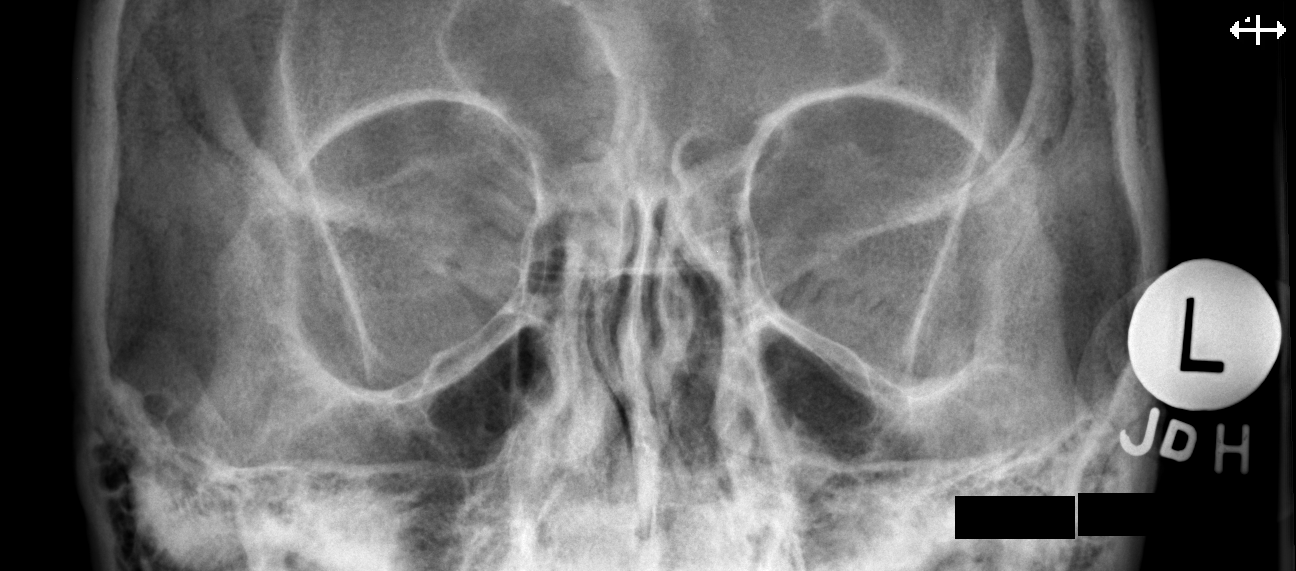

[w orbit pa (2 of 2)]
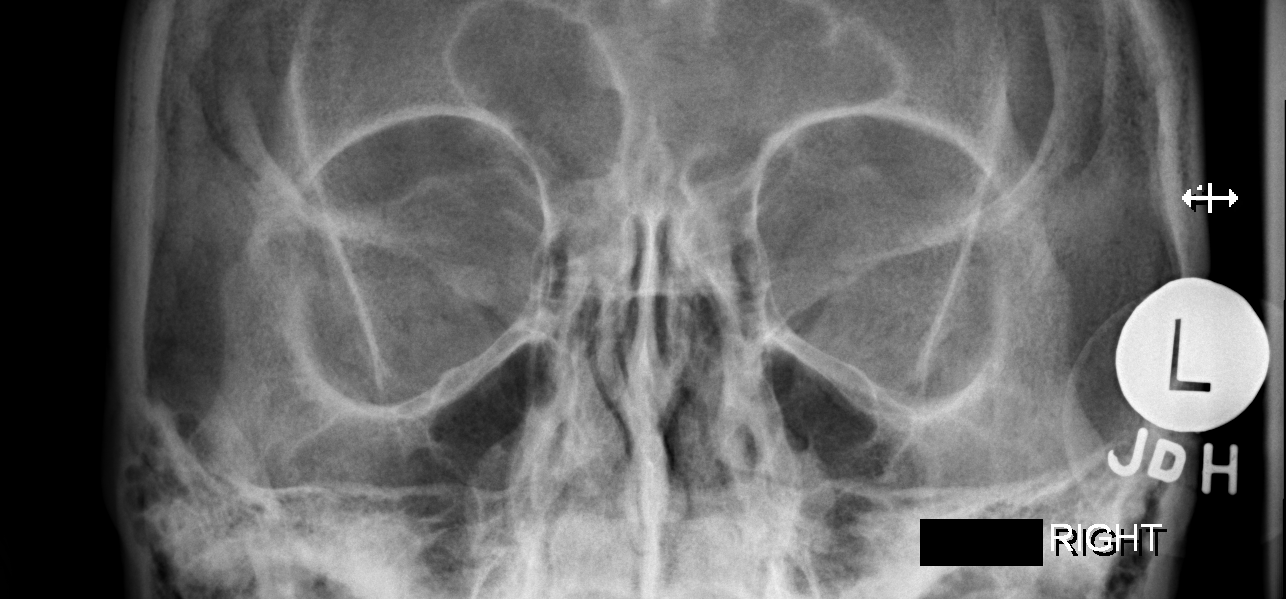

[2 of 2 positions shown; findings below may reference images not displayed]

FINDINGS: There is no evidence of metallic foreign body within the orbits. No
significant bone abnormality identified.
IMPRESSION: No evidence of metallic foreign body within the orbits.

## 2017-02-20 DIAGNOSIS — H16223 Keratoconjunctivitis sicca, not specified as Sjogren's, bilateral: Secondary | ICD-10-CM | POA: Diagnosis not present

## 2017-02-20 DIAGNOSIS — H35361 Drusen (degenerative) of macula, right eye: Secondary | ICD-10-CM | POA: Diagnosis not present

## 2017-02-20 DIAGNOSIS — H2513 Age-related nuclear cataract, bilateral: Secondary | ICD-10-CM | POA: Diagnosis not present

## 2017-07-08 ENCOUNTER — Emergency Department (HOSPITAL_COMMUNITY): Payer: Medicare Other

## 2017-07-08 ENCOUNTER — Emergency Department (HOSPITAL_COMMUNITY)
Admission: EM | Admit: 2017-07-08 | Discharge: 2017-07-08 | Disposition: A | Discharge status: Home / Self Care | Payer: Medicare Other | Attending: Emergency Medicine | Admitting: Emergency Medicine

## 2017-07-08 ENCOUNTER — Encounter (HOSPITAL_COMMUNITY): Payer: Self-pay | Admitting: *Deleted

## 2017-07-08 DIAGNOSIS — R079 Chest pain, unspecified: Secondary | ICD-10-CM | POA: Diagnosis present

## 2017-07-08 DIAGNOSIS — J449 Chronic obstructive pulmonary disease, unspecified: Secondary | ICD-10-CM | POA: Diagnosis not present

## 2017-07-08 DIAGNOSIS — Z87891 Personal history of nicotine dependence: Secondary | ICD-10-CM | POA: Insufficient documentation

## 2017-07-08 DIAGNOSIS — R0789 Other chest pain: Secondary | ICD-10-CM | POA: Diagnosis not present

## 2017-07-08 LAB — CBC
HEMATOCRIT: 43.2 % (ref 39.0–52.0)
HEMOGLOBIN: 14.4 g/dL (ref 13.0–17.0)
MCH: 29.3 pg (ref 26.0–34.0)
MCHC: 33.3 g/dL (ref 30.0–36.0)
MCV: 88 fL (ref 78.0–100.0)
Platelets: 155 10*3/uL (ref 150–400)
RBC: 4.91 MIL/uL (ref 4.22–5.81)
RDW: 14.6 % (ref 11.5–15.5)
WBC: 4.9 10*3/uL (ref 4.0–10.5)

## 2017-07-08 LAB — BASIC METABOLIC PANEL
ANION GAP: 8 (ref 5–15)
BUN: 12 mg/dL (ref 6–20)
CO2: 24 mmol/L (ref 22–32)
Calcium: 8.9 mg/dL (ref 8.9–10.3)
Chloride: 105 mmol/L (ref 101–111)
Creatinine, Ser: 0.94 mg/dL (ref 0.61–1.24)
GLUCOSE: 107 mg/dL — AB (ref 65–99)
POTASSIUM: 3.5 mmol/L (ref 3.5–5.1)
Sodium: 137 mmol/L (ref 135–145)

## 2017-07-08 LAB — I-STAT TROPONIN, ED
Troponin i, poc: 0 ng/mL (ref 0.00–0.08)
Troponin i, poc: 0.01 ng/mL (ref 0.00–0.08)

## 2017-07-08 MED ORDER — ACETAMINOPHEN 500 MG PO TABS
1000.0000 mg | ORAL_TABLET | Freq: Once | ORAL | Status: AC
Start: 2017-07-08 — End: 2017-07-08
  Administered 2017-07-08: 1000 mg via ORAL
  Filled 2017-07-08: qty 2

## 2017-07-08 MED ORDER — ASPIRIN 81 MG PO CHEW: 324.0000 mg | CHEWABLE_TABLET | Freq: Once | ORAL | Status: DC

## 2017-07-08 NOTE — ED Triage Notes (Signed)
Pt is here with stabbing chest pain to mid sternal area and states it has been intermittent for 3 weeks, thought it was indigestion and it just started getting worse today.

## 2017-07-08 NOTE — ED Notes (Signed)
ED Provider at bedside. 

## 2017-07-08 NOTE — ED Provider Notes (Signed)
[facility] DEPT Provider Note   CSN: [id number] Arrival date & time: 07/08/17  1249     History   Chief Complaint Chief Complaint  Patient presents with  . Chest Pain    HPI Charles Reed is a 71 y.o. male with past medical history significant for arthritis and vertigo who presents today with chief complaint acute onset, intermittent transient chest pain for 3 weeks which worsened today.states pain is throbbing in nature, initially started on the left side of his chest, resolved, and then he felt it on the right side of his chest. He states that the pain alternates, but he is pain-free at times. States that today pain worsened and was present in the center of his chest. He denies chest pain, nausea, vomiting, diaphoresis, fevers, chills, or cough. No aggravating or alleviating factors are noted. He took an aspirin today with out significant relief of his symptoms. He is a former smoker and quit smoking in 1985. No recent travel or surgeries, no hemoptysis, not on any testosterone replacement therapy, no prior history of DVT or PE, no history of cancer.he is not followed by cardiology, and has not had a cardiac workup in the past. Denies palpitations or leg swelling. Denies significant caffeine intake. Denies symptoms at this time.   The history is provided by the patient.    Past Medical History:  Diagnosis Date  . Arthritis    lumbar stenosis s/p back surgery-doing much better  . 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 use to give blood, but 20 yrs. ago turned down because of finding Hepatitis (?C) antibody in blood   . Hx of seasonal allergies    not now  . Vertigo     Patient Active Problem List   Diagnosis Date Noted  . Lumbar stenosis 08/05/2015    Past Surgical History:  Procedure Laterality Date  . COLONOSCOPY WITH PROPOFOL N/A 01/23/2016   Procedure: COLONOSCOPY WITH PROPOFOL;  Surgeon: Garlan Fair, MD;  Location: [facility];  Service: Endoscopy;   Laterality: N/A;  . LUMBAR LAMINECTOMY/DECOMPRESSION MICRODISCECTOMY N/A 08/05/2015   Procedure: Laminectomy and Foraminotomy - Lumbar One-Two,Lumbar Two-Three,Lumbar Three-Four;  Surgeon: Kristeen Miss, MD;  Location: [facility];  Service: Neurosurgery;  Laterality: N/A;       Home Medications    Prior to Admission medications   Medication Sig Start Date End Date Taking? Authorizing Provider  fluticasone (FLONASE) 50 MCG/ACT nasal spray Place 1 spray into both nostrils daily as needed for allergies or rhinitis.    [provider]  Omega-3 Fatty Acids (FISH OIL) 1200 MG CAPS Take 1 capsule by mouth 4 (four) times a week.     [provider]    Family History No family history on file.  Social History Social History  Substance Use Topics  . Smoking status: Former Smoker    Quit date: 10/15/1984  . Smokeless tobacco: Never Used  . Alcohol use 4.2 oz/week    7 Cans of beer per week     Comment: Socially, /2-3 times per week but has in the past 2 months (8&9 -2016)- none      Allergies   Patient has no known allergies.   Review of Systems Review of Systems  Constitutional: Negative for chills, diaphoresis and fever.  Respiratory: Negative for cough and shortness of breath.   Cardiovascular: Positive for chest pain. Negative for palpitations and leg swelling.  Gastrointestinal: Negative for abdominal pain, nausea and vomiting.  Musculoskeletal: Positive for  arthralgias (chronic, unchanged).  Neurological: Negative for syncope, weakness and light-headedness.  All other systems reviewed and are negative.    Physical Exam Updated Vital Signs BP (!) 179/98   Pulse (!) 58   Temp 97.8 F (36.6 C)   Resp 15   SpO2 97%   Physical Exam  Constitutional: He is oriented to person, place, and time. He appears well-developed and well-nourished. No distress.  Resting comfortably in bed.   HENT:  Head: Normocephalic and atraumatic.  Eyes: Conjunctivae are  normal. Right eye exhibits no discharge. Left eye exhibits no discharge.  Neck: Normal range of motion. Neck supple. No JVD present. No tracheal deviation present.  Cardiovascular: Normal rate, regular rhythm, normal heart sounds and intact distal pulses.   2+ radial and DP/PT pulses bl, negative Homan's bl, no LE edema  Pulmonary/Chest: Effort normal and breath sounds normal. No respiratory distress. He has no wheezes. He has no rales. He exhibits no tenderness.  Equal rise and fall of chest, no increased work of breathing,   Abdominal: Soft. Bowel sounds are normal. He exhibits no distension. There is no tenderness.  Musculoskeletal: He exhibits no edema.  Neurological: He is alert and oriented to person, place, and time. No cranial nerve deficit or sensory deficit.  Skin: Skin is warm and dry. No erythema.  Psychiatric: He has a normal mood and affect. His behavior is normal.  Nursing note and vitals reviewed.    ED Treatments / Results  Labs (all labs ordered are listed, but only abnormal results are displayed) Labs Reviewed  BASIC METABOLIC PANEL - Abnormal; Notable for the following:       Result Value   Glucose, Bld 107 (*)    All other components within normal limits  CBC  I-STAT TROPONIN, ED  I-STAT TROPONIN, ED    EKG  EKG Interpretation  Date/Time:  Monday July 08 2017 12:58:57 EDT Ventricular Rate:  73 PR Interval:  142 QRS Duration: 86 QT Interval:  394 QTC Calculation: 434 R Axis:   -34 Text Interpretation:  Normal sinus rhythm Left axis deviation Abnormal ECG No significant change since last tracing Confirmed by Quintella Reichert [phone]) on 07/08/2017 6:30:49 PM       Radiology Dg Chest 2 View  Result Date: 07/08/2017 CLINICAL DATA:  Cp for two weeks w/ blood pressure elevated. NO SOB. EXAM: CHEST  2 VIEW COMPARISON:  Chest x-ray dated 05/01/2011. FINDINGS: Heart size and mediastinal contours are normal. Lungs are hyperexpanded. Lungs are clear. No  pleural effusion or pneumothorax seen. Mild degenerative spurring and ankylosis within the thoracic spine. No acute appearing osseous abnormality. IMPRESSION: 1. Hyperexpanded lungs indicating COPD. Suspect associated chronic bronchitic changes centrally. 2. No acute findings.  No evidence of pneumonia or pulmonary edema. Electronically Signed   By: Franki Cabot M.D.   On: 07/08/2017 13:58    Procedures Procedures (including critical care time)  Medications Ordered in ED Medications  acetaminophen (TYLENOL) tablet 1,000 mg (1,000 mg Oral Given 07/08/17 1903)     Initial Impression / Assessment and Plan / ED Course  I have reviewed the triage vital signs and the nursing notes.  Pertinent labs & imaging results that were available during my care of the patient were reviewed by me and considered in my medical decision making (see chart for details).     Patient presented with transient intermittent chest pain for 3 weeks. Afebrile, hypertensive while in the ED. No pain on assessment. EKG shows no significant change from  last tracing, no evidence of ST segment abnormality or arrhythmia.chest x-ray suggestive of COPD but no acute changes such as pneumonia or effusion. Serial troponins are negative and remainder of lab work is reassuring. HEART score of 3, he is low risk for ACS or MI. He is low risk for PE per Wells' criteria. No evidence of pericarditis, myocarditis. I suspect his pain is primarily musculoskeletal in nature. No evidence of hypertensive urgency or emergency, no end organ damage. However with increase in his blood pressure from baseline, he instructed patient to follow-up with primary care physician. He does have follow-up scheduled tomorrow afternoon, which I encouraged him to keep.discussed indications for return to the ED. Pt verbalized understanding of and agreement with plan and is safe for discharge home at this time. Patient seen and evaluated by Dr. Ralene Bathe, who agrees with  assessment and plan at this time.   Final Clinical Impressions(s) / ED Diagnoses   Final diagnoses:  Atypical chest pain    New Prescriptions Discharge Medication List as of 07/08/2017  7:35 PM       Renita Papa, PA-C 07/09/17 0034    Renita Papa, PA-C 07/09/17 8786    Quintella Reichert, MD 07/10/17 1235

## 2017-07-08 NOTE — Discharge Instructions (Signed)
Follow-up with your primary care physician for reevaluation of your symptoms and your elevated blood pressure. He may take ibuprofen or Tylenol as needed for pain. Return to the ED immediately if any concerning signs or symptoms develop such as persisting shortness of breath, worsening chest pain, fevers, passing out, or coughing up blood.

## 2017-07-09 DIAGNOSIS — R0789 Other chest pain: Secondary | ICD-10-CM | POA: Diagnosis not present

## 2017-07-09 DIAGNOSIS — I10 Essential (primary) hypertension: Secondary | ICD-10-CM | POA: Diagnosis not present

## 2017-07-09 DIAGNOSIS — Z23 Encounter for immunization: Secondary | ICD-10-CM | POA: Diagnosis not present

## 2017-07-25 DIAGNOSIS — M47816 Spondylosis without myelopathy or radiculopathy, lumbar region: Secondary | ICD-10-CM | POA: Diagnosis not present

## 2017-07-25 DIAGNOSIS — M48062 Spinal stenosis, lumbar region with neurogenic claudication: Secondary | ICD-10-CM | POA: Diagnosis not present

## 2017-08-06 DIAGNOSIS — I10 Essential (primary) hypertension: Secondary | ICD-10-CM | POA: Diagnosis not present

## 2017-11-27 DIAGNOSIS — M65311 Trigger thumb, right thumb: Secondary | ICD-10-CM | POA: Diagnosis not present

## 2017-12-02 DIAGNOSIS — H66003 Acute suppurative otitis media without spontaneous rupture of ear drum, bilateral: Secondary | ICD-10-CM | POA: Diagnosis not present

## 2017-12-02 DIAGNOSIS — I10 Essential (primary) hypertension: Secondary | ICD-10-CM | POA: Diagnosis not present

## 2017-12-02 DIAGNOSIS — J309 Allergic rhinitis, unspecified: Secondary | ICD-10-CM | POA: Diagnosis not present

## 2017-12-09 DIAGNOSIS — Z23 Encounter for immunization: Secondary | ICD-10-CM | POA: Diagnosis not present

## 2017-12-09 DIAGNOSIS — I10 Essential (primary) hypertension: Secondary | ICD-10-CM | POA: Diagnosis not present

## 2017-12-09 DIAGNOSIS — R079 Chest pain, unspecified: Secondary | ICD-10-CM | POA: Diagnosis not present

## 2017-12-09 DIAGNOSIS — R0789 Other chest pain: Secondary | ICD-10-CM | POA: Diagnosis not present

## 2017-12-09 DIAGNOSIS — E7800 Pure hypercholesterolemia, unspecified: Secondary | ICD-10-CM | POA: Diagnosis not present

## 2017-12-23 DIAGNOSIS — M79671 Pain in right foot: Secondary | ICD-10-CM | POA: Diagnosis not present

## 2017-12-23 DIAGNOSIS — M65311 Trigger thumb, right thumb: Secondary | ICD-10-CM | POA: Diagnosis not present

## 2018-01-21 DIAGNOSIS — D696 Thrombocytopenia, unspecified: Secondary | ICD-10-CM | POA: Diagnosis not present

## 2018-01-21 DIAGNOSIS — E7800 Pure hypercholesterolemia, unspecified: Secondary | ICD-10-CM | POA: Diagnosis not present

## 2018-01-21 DIAGNOSIS — R7309 Other abnormal glucose: Secondary | ICD-10-CM | POA: Diagnosis not present

## 2018-01-21 DIAGNOSIS — I10 Essential (primary) hypertension: Secondary | ICD-10-CM | POA: Diagnosis not present

## 2018-01-21 DIAGNOSIS — M5136 Other intervertebral disc degeneration, lumbar region: Secondary | ICD-10-CM | POA: Diagnosis not present

## 2018-01-21 DIAGNOSIS — J309 Allergic rhinitis, unspecified: Secondary | ICD-10-CM | POA: Diagnosis not present

## 2018-01-21 DIAGNOSIS — D72819 Decreased white blood cell count, unspecified: Secondary | ICD-10-CM | POA: Diagnosis not present

## 2018-01-21 DIAGNOSIS — N400 Enlarged prostate without lower urinary tract symptoms: Secondary | ICD-10-CM | POA: Diagnosis not present

## 2018-01-21 DIAGNOSIS — Z0000 Encounter for general adult medical examination without abnormal findings: Secondary | ICD-10-CM | POA: Diagnosis not present

## 2018-01-21 DIAGNOSIS — Z1389 Encounter for screening for other disorder: Secondary | ICD-10-CM | POA: Diagnosis not present

## 2018-01-21 DIAGNOSIS — Z6829 Body mass index (BMI) 29.0-29.9, adult: Secondary | ICD-10-CM | POA: Diagnosis not present

## 2018-01-23 ENCOUNTER — Telehealth: Payer: Self-pay | Admitting: Hematology and Oncology

## 2018-01-23 NOTE — Telephone Encounter (Signed)
Patient returned call to schedule appt he was given date/time / location & phone number

## 2018-02-03 ENCOUNTER — Telehealth (HOSPITAL_COMMUNITY): Payer: Self-pay | Admitting: Internal Medicine

## 2018-02-03 ENCOUNTER — Other Ambulatory Visit (HOSPITAL_COMMUNITY): Payer: Self-pay | Admitting: Internal Medicine

## 2018-02-03 DIAGNOSIS — R079 Chest pain, unspecified: Secondary | ICD-10-CM

## 2018-02-03 NOTE — Telephone Encounter (Signed)
Did not need to open encounter 

## 2018-02-06 ENCOUNTER — Inpatient Hospital Stay (HOSPITAL_COMMUNITY): Admission: RE | Admit: 2018-02-06 | Payer: Self-pay | Source: Ambulatory Visit

## 2018-02-06 ENCOUNTER — Encounter: Payer: Self-pay | Admitting: Hematology and Oncology

## 2018-03-05 ENCOUNTER — Ambulatory Visit (INDEPENDENT_AMBULATORY_CARE_PROVIDER_SITE_OTHER): Payer: Medicare Other | Admitting: Podiatry

## 2018-03-05 DIAGNOSIS — M10071 Idiopathic gout, right ankle and foot: Secondary | ICD-10-CM

## 2018-03-05 MED ORDER — COLCHICINE 0.6 MG PO TABS: 0.6000 mg | ORAL_TABLET | Freq: Every day | ORAL | 0 refills | Status: AC

## 2018-03-08 NOTE — Progress Notes (Signed)
   HPI: 72 year old male presenting today with a chief complaint of pain to the right hallux that began about one month ago. He states he had an X-Ray done and was told he had arthritis. He has not done anything for treatment. Walking and bearing weight increases the pain. Patient is here for further evaluation and treatment.   Past Medical History:  Diagnosis Date  . Arthritis    lumbar stenosis s/p back surgery-doing much better  . 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 use to give blood, but 20 yrs. ago turned down because of finding Hepatitis (?C) antibody in blood   . Hx of seasonal allergies    not now  . Vertigo      Physical Exam: General: The patient is alert and oriented x3 in no acute distress.  Dermatology: Skin is warm, dry and supple bilateral lower extremities. Negative for open lesions or macerations.  Vascular: Palpable pedal pulses bilaterally. No edema or erythema noted. Capillary refill within normal limits.  Neurological: Epicritic and protective threshold grossly intact bilaterally.   Musculoskeletal Exam: Pain on palpation to the right 1st MPJ with erythema and edema. Range of motion within normal limits to all pedal and ankle joints bilateral. Muscle strength 5/5 in all groups bilateral.    Assessment: 1. Gout/capsulitis 1st MPJ right   Plan of Care:  1. Patient evaluated. 2. Injection of 0.5 mLs Celestone Soluspan injected into the 1st MPJ of the right foot.  3. Prescription for Colcrys 0.6 mg #14 provided to patient.  4. Recommended good shoe gear.  5. Return to clinic as needed.   Patient is an avid bowler.     Edrick Kins, DPM [facility]  Dr. Edrick Kins, DPM    [address], [place] [address]                Office [phone]  Fax [phone]

## 2018-04-08 DIAGNOSIS — M79671 Pain in right foot: Secondary | ICD-10-CM | POA: Diagnosis not present

## 2018-04-08 DIAGNOSIS — M79674 Pain in right toe(s): Secondary | ICD-10-CM | POA: Diagnosis not present

## 2018-08-26 DIAGNOSIS — Z23 Encounter for immunization: Secondary | ICD-10-CM | POA: Diagnosis not present

## 2018-08-27 DIAGNOSIS — H2513 Age-related nuclear cataract, bilateral: Secondary | ICD-10-CM | POA: Diagnosis not present

## 2018-09-03 DIAGNOSIS — M79644 Pain in right finger(s): Secondary | ICD-10-CM | POA: Diagnosis not present

## 2018-09-25 ENCOUNTER — Telehealth: Payer: Self-pay | Admitting: Hematology

## 2018-09-25 ENCOUNTER — Encounter: Payer: Self-pay | Admitting: Hematology

## 2018-09-25 NOTE — Telephone Encounter (Signed)
New referral received from Dr. Lysle Rubens for thrombocytopenia. Pt has been cld and scheduled to see Dr. Irene Limbo on 1/14 at 10am. Pt aware to arrive 30 mintes early. Letter mailed.

## 2018-10-01 DIAGNOSIS — M79644 Pain in right finger(s): Secondary | ICD-10-CM | POA: Diagnosis not present

## 2018-10-27 NOTE — Progress Notes (Signed)
HEMATOLOGY/ONCOLOGY CONSULTATION NOTE  Date of Service: 10/28/2018  Patient Care Team: Patient, No Pcp Per as PCP - General (General Practice)  CHIEF COMPLAINTS/PURPOSE OF CONSULTATION:  Leukopenia  HISTORY OF PRESENTING ILLNESS:   Charles Reed is a wonderful 73 y.o. male who has been referred to [place] by Dr. Wenda Low for evaluation and management of thrombocytopenia and leukopenia. The pt reports that he is doing well overall.   The pt reports that he has to had a concern for frequent infections or severe infections. He notes that he has not previously heard of his lower platelets or WBCs. The pt denies any recent fevers, chills, night sweats or unexpected weight loss.  The pt notes that he has had an intermittent stabbing pain in his lower left chest, "once in a while," which does not present with exertion. He denies any association with the onset; less frequently than once a week; lasts "maybe a minute," goes away by itself. He notes it can occur when he is lying in bed. He notes that this has happened for the last 2 months, has not been evaluated, and denies it getting better or worse. Denies particular source of pain in his chest wall. He denies frequent heartburn, and denies his chest pain feeling like heartburn, but possibly feels like a muscle problem; pt bowls with his right arm frequently.   The pt notes that he used to donate blood, but sometime in the 1990s he was told he had the antibody of a type of hepatitis. He denies any needle exposure for drugs, tattoos, or receiving a blood transfusion. The pt denies knowledge of liver problems. The pt has 1-2 drinks of liquor each day, but denies the intention of getting drunk. The pt denies exceeding 4 drinks with any regularity.   The pt notes that he has a history of pain in his right foot about a year ago, and began Colchicine at that time- has not had a repeat attack of joint pain. The pt also takes fish oil, tumeric, and  amlodipine.  Most recent lab results (01/21/18) of CBC and CMP is as follows: all values are WNL except for WBC at 2.9k.  On review of systems, pt reports eating well, weight gain, intermittent stabbing chest pain, moving his bowels well, stable energy levels, and denies fevers, chills, night sweats, unexpected weight loss, pain along the spine, abdominal pains, testicular pain or swelling, problems passing urine, leg swelling, frequent infections, severe infections, and any other symptoms.   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 He denies any recreational drug use whatsoever. Denies tattoos or receiving a blood transfusion. Served in the [employer] for 5 years.  Consumes 1-2 drinks each day, denies more than 2 with any regularity.  On Family Hx the pt reports mother with a skin cancer and died in late 59s, aunt with heart attack, and denies blood disorders  MEDICAL HISTORY:  Past Medical History:  Diagnosis Date  . Arthritis    lumbar stenosis s/p back surgery-doing much better  . 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 use to give blood, but 20 yrs. ago turned down because of finding Hepatitis (?C) antibody in blood   . Hx of seasonal allergies    not now  . Vertigo     SURGICAL HISTORY: Past Surgical History:  Procedure Laterality Date  . COLONOSCOPY WITH PROPOFOL N/A  01/23/2016   Procedure: COLONOSCOPY WITH PROPOFOL;  Surgeon: Garlan Fair, MD;  Location: [facility];  Service: Endoscopy;  Laterality: N/A;  . LUMBAR LAMINECTOMY/DECOMPRESSION MICRODISCECTOMY N/A 08/05/2015   Procedure: Laminectomy and Foraminotomy - Lumbar One-Two,Lumbar Two-Three,Lumbar Three-Four;  Surgeon: Kristeen Miss, MD;  Location: [facility];  Service: Neurosurgery;  Laterality: N/A;    SOCIAL HISTORY: Social History   Socioeconomic History  . Marital status: Married    Spouse name: Not on file  . Number of  children: Not on file  . Years of education: Not on file  . Highest education level: Not on file  Occupational History  . Not on file  Social Needs  . Financial resource strain: Not on file  . Food insecurity:    Worry: Not on file    Inability: Not on file  . Transportation needs:    Medical: Not on file    Non-medical: Not on file  Tobacco Use  . Smoking status: Former Smoker    Last attempt to quit: 10/15/1984    Years since quitting: 34.0  . Smokeless tobacco: Never Used  Substance and Sexual Activity  . Alcohol use: Yes    Alcohol/week: 7.0 standard drinks    Types: 7 Cans of beer per week    Comment: Socially, /2-3 times per week but has in the past 2 months (8&9 -2016)- none   . Drug use: No  . Sexual activity: Not on file  Lifestyle  . Physical activity:    Days per week: Not on file    Minutes per session: Not on file  . Stress: Not on file  Relationships  . Social connections:    Talks on phone: Not on file    Gets together: Not on file    Attends religious service: Not on file    Active member of club or organization: Not on file    Attends meetings of clubs or organizations: Not on file    Relationship status: Not on file  . Intimate partner violence:    Fear of current or ex partner: Not on file    Emotionally abused: Not on file    Physically abused: Not on file    Forced sexual activity: Not on file  Other Topics Concern  . Not on file  Social History Narrative  . Not on file    FAMILY HISTORY: History reviewed. No pertinent family history.  ALLERGIES:  has No Known Allergies.  MEDICATIONS:  Current Outpatient Medications  Medication Sig Dispense Refill  . amLODipine (NORVASC) 5 MG tablet amlodipine 5 mg tablet  TAKE 1 TABLET BY MOUTH EVERY DAY    . fluticasone (FLONASE) 50 MCG/ACT nasal spray Place 1 spray into both nostrils daily as needed for allergies or rhinitis.    . Omega-3 Fatty Acids (FISH OIL) 1200 MG CAPS Take 1 capsule by mouth 4  (four) times a week.     . colchicine 0.6 MG tablet Take 1 tablet (0.6 mg total) by mouth daily. (Patient not taking: Reported on 10/28/2018) 14 tablet 0   No current facility-administered medications for this visit.     REVIEW OF SYSTEMS:    10 Point review of Systems was done is negative except as noted above.  PHYSICAL EXAMINATION:  . Vitals:   10/28/18 1011  BP: (!) 155/89  Pulse: 67  Resp: 18  Temp: 98 F (36.7 C)  SpO2: 100%   Filed Weights   10/28/18 1011  Weight: 194 lb 8 oz (  88.2 kg)   .Body mass index is 29.14 kg/m.  GENERAL:alert, in no acute distress and comfortable SKIN: no acute rashes, no significant lesions EYES: conjunctiva are pink and non-injected, sclera anicteric OROPHARYNX: MMM, no exudates, no oropharyngeal erythema or ulceration NECK: supple, no JVD LYMPH:  no palpable lymphadenopathy in the cervical, axillary or inguinal regions LUNGS: clear to auscultation b/l with normal respiratory effort HEART: regular rate & rhythm ABDOMEN:  normoactive bowel sounds , non tender, not distended. No palpable hepatosplenomegaly.  Extremity: no pedal edema PSYCH: alert & oriented x 3 with fluent speech NEURO: no focal motor/sensory deficits  LABORATORY DATA:  I have reviewed the data as listed  . CBC Latest Ref Rng & Units 10/28/2018 10/28/2018 07/08/2017  WBC 4.0 - 10.5 K/uL 4.1 - 4.9  Hemoglobin 13.0 - 17.0 g/dL 15.1 - 14.4  Hematocrit 37.5 - 51.0 % 45.0 43.7 43.2  Platelets 150 - 400 K/uL 150 - 155   . CBC    Component Value Date/Time   WBC 4.1 10/28/2018 1314   RBC 5.20 10/28/2018 1314   HGB 15.1 10/28/2018 1314   HCT 43.7 10/28/2018 1314   HCT 45.0 10/28/2018 1314   PLT 150 10/28/2018 1314   MCV 86.5 10/28/2018 1314   MCH 29.0 10/28/2018 1314   MCHC 33.6 10/28/2018 1314   RDW 14.6 10/28/2018 1314   LYMPHSABS 1.8 10/28/2018 1314   MONOABS 0.2 10/28/2018 1314   EOSABS 0.2 10/28/2018 1314   BASOSABS 0.0 10/28/2018 1314     . CMP Latest  Ref Rng & Units 10/28/2018 07/08/2017 09/17/2015  Glucose 70 - 99 mg/dL 90 107(H) 89  BUN 8 - 23 mg/dL 10 12 12   Creatinine 0.61 - 1.24 mg/dL 0.90 0.94 1.02  Sodium 135 - 145 mmol/L 141 137 140  Potassium 3.5 - 5.1 mmol/L 3.8 3.5 3.4(L)  Chloride 98 - 111 mmol/L 105 105 104  CO2 22 - 32 mmol/L 28 24 26   Calcium 8.9 - 10.3 mg/dL 9.1 8.9 9.2  Total Protein 6.5 - 8.1 g/dL 8.0 - -  Total Bilirubin 0.3 - 1.2 mg/dL 1.2 - -  Alkaline Phos 38 - 126 U/L 106 - -  AST 15 - 41 U/L 26 - -  ALT 0 - 44 U/L 52(H) - -    01/21/18 CBC:    RADIOGRAPHIC STUDIES: I have personally reviewed the radiological images as listed and agreed with the findings in the report. No results found.  ASSESSMENT & PLAN:   73 y.o. male with  1. Leukopenia PLAN -Advised that the pt call his PCP today regarding his intermittent "stabbing" chest pain. Non-exertional. Not progressive.  -Discussed patient's most recent labs from 01/21/18, WBC at 2.9k, HGB normal at 14.2, PLT at 157k -Reviewed previous labs from 01/21/17: WBC at 3.0k, HGB normal at 14.7, PLT at 143k. Labs from 12/20/15 revealed WBC at 3.6k, HGB normal at 15.0 and PLT at 136k -Discussed that colchicine can cause some leukopenia -Discussed that Hep B and Hep C can cause thrombocytopenia or leukopenia; will check labs today as pt is unsure which type he was told he had -Discussed that the patient's previous mild thrombocytopenia has normalized and was not at a clinically concerning value prior to this -Discussed that the patient has mild leukopenia; will collect labs today for further evaluation  . Orders Placed This Encounter  Procedures  . CBC with Differential/Platelet    Standing Status:   Future    Number of Occurrences:   1  Standing Expiration Date:   12/02/2019  . CMP ([facility] only)    Standing Status:   Future    Number of Occurrences:   1    Standing Expiration Date:   10/29/2019  . Hepatitis C antibody    Standing Status:   Future    Number  of Occurrences:   1    Standing Expiration Date:   10/29/2019  . Hepatitis B core antibody, total    Standing Status:   Future    Number of Occurrences:   1    Standing Expiration Date:   10/28/2019  . Hepatitis B surface antigen    Standing Status:   Future    Number of Occurrences:   1    Standing Expiration Date:   12/02/2019  . Vitamin B12    Standing Status:   Future    Number of Occurrences:   1    Standing Expiration Date:   10/28/2019  . Folate RBC    Standing Status:   Future    Number of Occurrences:   1    Standing Expiration Date:   10/29/2019    Labs today RTC with Dr Irene Limbo with labs as needed based on labs   All of the patients questions were answered with apparent satisfaction. The patient knows to call the clinic with any problems, questions or concerns.  The total time spent in the appt was 35 minutes and more than 50% was on counseling and direct patient cares.    Sullivan Lone MD MS AAHIVMS [facility] Hematology/Oncology Physician [facility]  (Office):       [phone] (Work cell):  [phone] (Fax):           [phone]  10/28/2018 11:16 AM  I, Baldwin [place], am acting as a scribe for Dr. Sullivan Lone.   .I have reviewed the above documentation for accuracy and completeness, and I agree with the above. Brunetta Genera MD

## 2018-10-28 ENCOUNTER — Inpatient Hospital Stay: Payer: Medicare Other | Attending: Hematology | Admitting: Hematology

## 2018-10-28 ENCOUNTER — Inpatient Hospital Stay: Payer: Medicare Other

## 2018-10-28 ENCOUNTER — Encounter: Payer: Self-pay | Admitting: Hematology

## 2018-10-28 ENCOUNTER — Telehealth: Payer: Self-pay

## 2018-10-28 VITALS — BP 155/89 | HR 67 | Temp 98.0°F | Resp 18 | Ht 68.5 in | Wt 194.5 lb

## 2018-10-28 DIAGNOSIS — Z79899 Other long term (current) drug therapy: Secondary | ICD-10-CM | POA: Diagnosis not present

## 2018-10-28 DIAGNOSIS — D72819 Decreased white blood cell count, unspecified: Secondary | ICD-10-CM | POA: Insufficient documentation

## 2018-10-28 DIAGNOSIS — D696 Thrombocytopenia, unspecified: Secondary | ICD-10-CM

## 2018-10-28 DIAGNOSIS — Z87891 Personal history of nicotine dependence: Secondary | ICD-10-CM | POA: Insufficient documentation

## 2018-10-28 LAB — CMP (CANCER CENTER ONLY)
ALT: 52 U/L — ABNORMAL HIGH (ref 0–44)
AST: 26 U/L (ref 15–41)
Albumin: 4.2 g/dL (ref 3.5–5.0)
Alkaline Phosphatase: 106 U/L (ref 38–126)
Anion gap: 8 (ref 5–15)
BUN: 10 mg/dL (ref 8–23)
CO2: 28 mmol/L (ref 22–32)
Calcium: 9.1 mg/dL (ref 8.9–10.3)
Chloride: 105 mmol/L (ref 98–111)
Creatinine: 0.9 mg/dL (ref 0.61–1.24)
GFR, Estimated: >60 mL/min (ref >60)
Glucose, Bld: 90 mg/dL (ref 70–99)
POTASSIUM: 3.8 mmol/L (ref 3.5–5.1)
Sodium: 141 mmol/L (ref 135–145)
Total Bilirubin: 1.2 mg/dL (ref 0.3–1.2)
Total Protein: 8 g/dL (ref 6.5–8.1)

## 2018-10-28 LAB — CBC WITH DIFFERENTIAL/PLATELET
Abs Immature Granulocytes: 0.01 10*3/uL (ref 0.00–0.07)
BASOS ABS: 0 10*3/uL (ref 0.0–0.1)
Basophils Relative: 1 %
Eosinophils Absolute: 0.2 10*3/uL (ref 0.0–0.5)
Eosinophils Relative: 6 %
HCT: 45 % (ref 39.0–52.0)
Hemoglobin: 15.1 g/dL (ref 13.0–17.0)
Immature Granulocytes: 0 %
Lymphocytes Relative: 45 %
Lymphs Abs: 1.8 10*3/uL (ref 0.7–4.0)
MCH: 29 pg (ref 26.0–34.0)
MCHC: 33.6 g/dL (ref 30.0–36.0)
MCV: 86.5 fL (ref 80.0–100.0)
Monocytes Absolute: 0.2 10*3/uL (ref 0.1–1.0)
Monocytes Relative: 5 %
Neutro Abs: 1.8 10*3/uL (ref 1.7–7.7)
Neutrophils Relative %: 43 %
Platelets: 150 10*3/uL (ref 150–400)
RBC: 5.2 MIL/uL (ref 4.22–5.81)
RDW: 14.6 % (ref 11.5–15.5)
WBC: 4.1 10*3/uL (ref 4.0–10.5)
nRBC: 0 % (ref 0.0–0.2)

## 2018-10-28 LAB — VITAMIN B12: Vitamin B-12: 291 pg/mL (ref 180–914)

## 2018-10-28 NOTE — Patient Instructions (Signed)
Thank you for choosing [facility] to provide your oncology and hematology care.  To afford each patient quality time with our providers, please arrive 30 minutes before your scheduled appointment time.  If you arrive late for your appointment, you may be asked to reschedule.  We strive to give you quality time with our providers, and arriving late affects you and other patients whose appointments are after yours.    If you are a no show for multiple scheduled visits, you may be dismissed from the clinic at the providers discretion.     Again, thank you for choosing [facility], our hope is that these requests will decrease the amount of time that you wait before being seen by our physicians.  ______________________________________________________________________   Should you have questions after your visit to the [facility], please contact our office at [phone] between the hours of 8:30 and 4:30 p.m.    Voicemails left after 4:30p.m will not be returned until the following business day.     For prescription refill requests, please have your pharmacy contact us directly.  Please also try to allow 48 hours for prescription requests.     Please contact the scheduling department for questions regarding scheduling.  For scheduling of procedures such as PET scans, CT scans, MRI, Ultrasound, etc please contact central scheduling at [phone].     Resources For Cancer Patients and Caregivers:    Oncolink.org:  A wonderful resource for patients and healthcare providers for information regarding your disease, ways to tract your treatment, what to expect, etc.      American Cancer Society:  800-227-2345  Can help patients locate various types of support and financial assistance   Cancer Care: 1-800-813-HOPE (4673) Provides financial assistance, online support groups, medication/co-pay assistance.     [place] DSS:  336-641-3447 Where to apply  for food stamps, Medicaid, and utility assistance   Medicare Rights Center: 800-333-4114 Helps people with Medicare understand their rights and benefits, navigate the Medicare system, and secure the quality healthcare they deserve   SCAT: 336-333-6589 [employer]'s shared-ride transportation service for eligible riders who have a disability that prevents them from riding the fixed route bus.     For additional information on assistance programs please contact our social worker:   Abigail Elmore:  [phone]  

## 2018-10-28 NOTE — Telephone Encounter (Signed)
Printed avs and added patient on for labs today. Per 1/14 los RTC with Dr Irene Limbo with labs as needed based on labs

## 2018-10-29 LAB — HEPATITIS C ANTIBODY: HCV Ab: <0.1 s/co ratio (ref 0.0–0.9)

## 2018-10-29 LAB — FOLATE RBC
Folate, Hemolysate: >620 ng/mL
Folate, RBC: >1419 ng/mL (ref >498)
Hematocrit: 43.7 % (ref 37.5–51.0)

## 2018-10-29 LAB — HEPATITIS B CORE ANTIBODY, TOTAL: Hep B Core Total Ab: POSITIVE — AB

## 2018-10-29 LAB — HEPATITIS B SURFACE ANTIGEN: Hepatitis B Surface Ag: NEGATIVE

## 2018-11-03 ENCOUNTER — Telehealth: Payer: Self-pay | Admitting: Emergency Medicine

## 2018-11-03 NOTE — Telephone Encounter (Signed)
Calling on behalf of MD Irene Limbo to relay results.  Informed pt that WBC count had normalized but his B12 was borderline.  VO from [place] to advise pt to start b12 500 mcg PO daily and to have B12 levels rechecked in 3 months by PCP.  Pt verbalized understanding of instructions and to call back with any questions/concerns.

## 2018-11-07 DIAGNOSIS — R0789 Other chest pain: Secondary | ICD-10-CM | POA: Diagnosis not present

## 2018-12-11 DIAGNOSIS — M48062 Spinal stenosis, lumbar region with neurogenic claudication: Secondary | ICD-10-CM | POA: Diagnosis not present

## 2019-02-05 DIAGNOSIS — I10 Essential (primary) hypertension: Secondary | ICD-10-CM | POA: Diagnosis not present

## 2019-02-05 DIAGNOSIS — Z0000 Encounter for general adult medical examination without abnormal findings: Secondary | ICD-10-CM | POA: Diagnosis not present

## 2019-02-05 DIAGNOSIS — Z1389 Encounter for screening for other disorder: Secondary | ICD-10-CM | POA: Diagnosis not present

## 2019-02-05 DIAGNOSIS — E7800 Pure hypercholesterolemia, unspecified: Secondary | ICD-10-CM | POA: Diagnosis not present

## 2019-02-05 DIAGNOSIS — R7303 Prediabetes: Secondary | ICD-10-CM | POA: Diagnosis not present

## 2019-02-05 DIAGNOSIS — N400 Enlarged prostate without lower urinary tract symptoms: Secondary | ICD-10-CM | POA: Diagnosis not present

## 2019-02-05 DIAGNOSIS — M5136 Other intervertebral disc degeneration, lumbar region: Secondary | ICD-10-CM | POA: Diagnosis not present

## 2019-02-05 DIAGNOSIS — D72819 Decreased white blood cell count, unspecified: Secondary | ICD-10-CM | POA: Diagnosis not present

## 2019-02-05 DIAGNOSIS — D696 Thrombocytopenia, unspecified: Secondary | ICD-10-CM | POA: Diagnosis not present

## 2019-02-13 DIAGNOSIS — I10 Essential (primary) hypertension: Secondary | ICD-10-CM | POA: Diagnosis not present

## 2019-02-13 DIAGNOSIS — E7800 Pure hypercholesterolemia, unspecified: Secondary | ICD-10-CM | POA: Diagnosis not present

## 2019-02-13 DIAGNOSIS — R7303 Prediabetes: Secondary | ICD-10-CM | POA: Diagnosis not present

## 2019-02-13 DIAGNOSIS — N400 Enlarged prostate without lower urinary tract symptoms: Secondary | ICD-10-CM | POA: Diagnosis not present

## 2019-07-16 DIAGNOSIS — Z23 Encounter for immunization: Secondary | ICD-10-CM | POA: Diagnosis not present

## 2019-08-06 DIAGNOSIS — I10 Essential (primary) hypertension: Secondary | ICD-10-CM | POA: Diagnosis not present

## 2019-08-06 DIAGNOSIS — N529 Male erectile dysfunction, unspecified: Secondary | ICD-10-CM | POA: Diagnosis not present

## 2019-08-06 DIAGNOSIS — D696 Thrombocytopenia, unspecified: Secondary | ICD-10-CM | POA: Diagnosis not present

## 2019-08-06 DIAGNOSIS — R7303 Prediabetes: Secondary | ICD-10-CM | POA: Diagnosis not present

## 2019-11-12 DIAGNOSIS — H903 Sensorineural hearing loss, bilateral: Secondary | ICD-10-CM | POA: Insufficient documentation

## 2019-11-12 DIAGNOSIS — H8110 Benign paroxysmal vertigo, unspecified ear: Secondary | ICD-10-CM | POA: Diagnosis not present

## 2019-11-12 DIAGNOSIS — H9313 Tinnitus, bilateral: Secondary | ICD-10-CM | POA: Diagnosis not present

## 2019-11-25 DIAGNOSIS — R42 Dizziness and giddiness: Secondary | ICD-10-CM | POA: Diagnosis not present

## 2019-12-05 ENCOUNTER — Ambulatory Visit: Payer: Medicare Other | Attending: Internal Medicine

## 2019-12-05 DIAGNOSIS — Z23 Encounter for immunization: Secondary | ICD-10-CM

## 2019-12-05 NOTE — Progress Notes (Signed)
   Covid-19 Vaccination Clinic  Name:  Charles Reed    MRN: [id number] DOB: Apr 17, 1946  12/05/2019  Charles Reed was observed post Covid-19 immunization for 15 minutes without incidence. He was provided with Vaccine Information Sheet and instruction to access the V-Safe system.   Charles Reed was instructed to call 911 with any severe reactions post vaccine: Marland Kitchen Difficulty breathing  . Swelling of your face and throat  . A fast heartbeat  . A bad rash all over your body  . Dizziness and weakness    Immunizations Administered    Name Date Dose VIS Date Route   Pfizer COVID-19 Vaccine 12/05/2019  8:14 AM 0.3 mL 09/25/2019 Intramuscular   Manufacturer: [place]   Lot: [id number]   [place]: [id number]

## 2019-12-28 ENCOUNTER — Ambulatory Visit: Payer: Medicare Other | Attending: Internal Medicine

## 2019-12-28 DIAGNOSIS — Z23 Encounter for immunization: Secondary | ICD-10-CM

## 2019-12-28 NOTE — Progress Notes (Signed)
   Covid-19 Vaccination Clinic  Name:  Charles Reed    MRN: [id number] DOB: 1946-05-31  12/28/2019  [name] was observed post Covid-19 immunization for 15 minutes without incident. He was provided with Vaccine Information Sheet and instruction to access the V-Safe system.   [name] was instructed to call 911 with any severe reactions post vaccine: Marland Kitchen Difficulty breathing  . Swelling of face and throat  . A fast heartbeat  . A bad rash all over body  . Dizziness and weakness   Immunizations Administered    Name Date Dose VIS Date Route   Pfizer COVID-19 Vaccine 12/28/2019  8:15 AM 0.3 mL 09/25/2019 Intramuscular   Manufacturer: [place]   Lot: [id number]   [place]: [id number]

## 2020-02-08 DIAGNOSIS — D696 Thrombocytopenia, unspecified: Secondary | ICD-10-CM | POA: Diagnosis not present

## 2020-02-08 DIAGNOSIS — E7800 Pure hypercholesterolemia, unspecified: Secondary | ICD-10-CM | POA: Diagnosis not present

## 2020-02-08 DIAGNOSIS — J309 Allergic rhinitis, unspecified: Secondary | ICD-10-CM | POA: Diagnosis not present

## 2020-02-08 DIAGNOSIS — Z0000 Encounter for general adult medical examination without abnormal findings: Secondary | ICD-10-CM | POA: Diagnosis not present

## 2020-02-08 DIAGNOSIS — Z1389 Encounter for screening for other disorder: Secondary | ICD-10-CM | POA: Diagnosis not present

## 2020-02-08 DIAGNOSIS — N400 Enlarged prostate without lower urinary tract symptoms: Secondary | ICD-10-CM | POA: Diagnosis not present

## 2020-02-08 DIAGNOSIS — I10 Essential (primary) hypertension: Secondary | ICD-10-CM | POA: Diagnosis not present

## 2020-02-08 DIAGNOSIS — Z7189 Other specified counseling: Secondary | ICD-10-CM | POA: Diagnosis not present

## 2020-02-08 DIAGNOSIS — H9313 Tinnitus, bilateral: Secondary | ICD-10-CM | POA: Diagnosis not present

## 2020-02-08 DIAGNOSIS — R7309 Other abnormal glucose: Secondary | ICD-10-CM | POA: Diagnosis not present

## 2020-02-16 DIAGNOSIS — H2513 Age-related nuclear cataract, bilateral: Secondary | ICD-10-CM | POA: Diagnosis not present

## 2020-02-16 DIAGNOSIS — H35361 Drusen (degenerative) of macula, right eye: Secondary | ICD-10-CM | POA: Diagnosis not present

## 2020-02-16 DIAGNOSIS — H16223 Keratoconjunctivitis sicca, not specified as Sjogren's, bilateral: Secondary | ICD-10-CM | POA: Diagnosis not present

## 2020-07-13 DIAGNOSIS — Z23 Encounter for immunization: Secondary | ICD-10-CM | POA: Diagnosis not present

## 2020-07-26 DIAGNOSIS — Z23 Encounter for immunization: Secondary | ICD-10-CM | POA: Diagnosis not present

## 2020-08-09 DIAGNOSIS — M5136 Other intervertebral disc degeneration, lumbar region: Secondary | ICD-10-CM | POA: Diagnosis not present

## 2020-08-09 DIAGNOSIS — D72819 Decreased white blood cell count, unspecified: Secondary | ICD-10-CM | POA: Diagnosis not present

## 2020-08-09 DIAGNOSIS — I10 Essential (primary) hypertension: Secondary | ICD-10-CM | POA: Diagnosis not present

## 2020-08-09 DIAGNOSIS — R7303 Prediabetes: Secondary | ICD-10-CM | POA: Diagnosis not present

## 2020-08-09 DIAGNOSIS — J309 Allergic rhinitis, unspecified: Secondary | ICD-10-CM | POA: Diagnosis not present

## 2020-08-09 DIAGNOSIS — D696 Thrombocytopenia, unspecified: Secondary | ICD-10-CM | POA: Diagnosis not present

## 2020-08-09 DIAGNOSIS — N529 Male erectile dysfunction, unspecified: Secondary | ICD-10-CM | POA: Diagnosis not present

## 2021-02-08 DIAGNOSIS — Z23 Encounter for immunization: Secondary | ICD-10-CM | POA: Diagnosis not present

## 2021-02-08 DIAGNOSIS — D696 Thrombocytopenia, unspecified: Secondary | ICD-10-CM | POA: Diagnosis not present

## 2021-02-08 DIAGNOSIS — D72819 Decreased white blood cell count, unspecified: Secondary | ICD-10-CM | POA: Diagnosis not present

## 2021-02-08 DIAGNOSIS — N529 Male erectile dysfunction, unspecified: Secondary | ICD-10-CM | POA: Diagnosis not present

## 2021-02-08 DIAGNOSIS — I10 Essential (primary) hypertension: Secondary | ICD-10-CM | POA: Diagnosis not present

## 2021-02-08 DIAGNOSIS — R7303 Prediabetes: Secondary | ICD-10-CM | POA: Diagnosis not present

## 2021-02-08 DIAGNOSIS — Z1389 Encounter for screening for other disorder: Secondary | ICD-10-CM | POA: Diagnosis not present

## 2021-02-08 DIAGNOSIS — J309 Allergic rhinitis, unspecified: Secondary | ICD-10-CM | POA: Diagnosis not present

## 2021-02-08 DIAGNOSIS — N400 Enlarged prostate without lower urinary tract symptoms: Secondary | ICD-10-CM | POA: Diagnosis not present

## 2021-02-08 DIAGNOSIS — Z0000 Encounter for general adult medical examination without abnormal findings: Secondary | ICD-10-CM | POA: Diagnosis not present

## 2021-02-08 DIAGNOSIS — E7800 Pure hypercholesterolemia, unspecified: Secondary | ICD-10-CM | POA: Diagnosis not present

## 2021-02-08 DIAGNOSIS — M5136 Other intervertebral disc degeneration, lumbar region: Secondary | ICD-10-CM | POA: Diagnosis not present

## 2021-04-25 DIAGNOSIS — I8002 Phlebitis and thrombophlebitis of superficial vessels of left lower extremity: Secondary | ICD-10-CM | POA: Diagnosis not present

## 2021-04-26 ENCOUNTER — Ambulatory Visit (INDEPENDENT_AMBULATORY_CARE_PROVIDER_SITE_OTHER): Payer: Medicare HMO

## 2021-04-26 ENCOUNTER — Other Ambulatory Visit: Payer: Self-pay

## 2021-04-26 ENCOUNTER — Ambulatory Visit (INDEPENDENT_AMBULATORY_CARE_PROVIDER_SITE_OTHER): Payer: Medicare HMO | Admitting: Podiatry

## 2021-04-26 DIAGNOSIS — M778 Other enthesopathies, not elsewhere classified: Secondary | ICD-10-CM | POA: Diagnosis not present

## 2021-04-26 MED ORDER — MELOXICAM 15 MG PO TABS: 15.0000 mg | ORAL_TABLET | Freq: Every day | ORAL | 1 refills | Status: AC

## 2021-04-26 MED ORDER — BETAMETHASONE SOD PHOS & ACET 6 (3-3) MG/ML IJ SUSP
3.0000 mg | Freq: Once | INTRAMUSCULAR | Status: AC
  Administered 2021-04-26: 3 mg via INTRA_ARTICULAR

## 2021-04-26 NOTE — Progress Notes (Signed)
   HPI: 75 y.o. male presenting today as a reestablish new patient for evaluation of left foot pain is been going on for a few weeks now.  Patient states that he went to his PCP and topical lidocaine was prescribed.  He continues to have pain and tenderness to the left midfoot.  He presents for further treatment and evaluation  Past Medical History:  Diagnosis Date   Arthritis    lumbar stenosis s/p back surgery-doing much better   Hepatitis C antibody test positive    pt. use to give blood, but 20 yrs. ago turned down because of finding Hepatitis (?C) antibody in blood    Hx of seasonal allergies    not now   Vertigo      Physical Exam: General: The patient is alert and oriented x3 in no acute distress.  Dermatology: Skin is warm, dry and supple bilateral lower extremities. Negative for open lesions or macerations.  Vascular: Palpable pedal pulses bilaterally. No edema or erythema noted. Capillary refill within normal limits.  Neurological: Epicritic and protective threshold grossly intact bilaterally.   Musculoskeletal Exam: Range of motion within normal limits to all pedal and ankle joints bilateral. Muscle strength 5/5 in all groups bilateral.  Pain on palpation left midtarsal joint  Radiographic Exam:  Normal osseous mineralization. Joint spaces preserved. No fracture/dislocation/boney destruction.    Assessment: 1.  Capsulitis left foot  Plan of Care:  1. Patient evaluated. X-Rays reviewed.  2. Injection of 0.5 cc Celestone Soluspan injected into the left midtarsal joint 3.  Prescription for meloxicam 15 mg daily 4.  Recommend good supportive shoes and sneakers 5.  Return to clinic as needed  *patient is an avid Ricki Miller, DPM [facility]  Dr. Edrick Kins, DPM    [address], [place] [address]                Office [phone]  Fax [phone]

## 2021-04-26 NOTE — Progress Notes (Signed)
dg 

## 2021-05-23 ENCOUNTER — Ambulatory Visit (INDEPENDENT_AMBULATORY_CARE_PROVIDER_SITE_OTHER): Payer: Medicare HMO

## 2021-05-23 ENCOUNTER — Other Ambulatory Visit: Payer: Self-pay

## 2021-05-23 ENCOUNTER — Ambulatory Visit (INDEPENDENT_AMBULATORY_CARE_PROVIDER_SITE_OTHER): Payer: Medicare HMO | Admitting: Podiatry

## 2021-05-23 DIAGNOSIS — S93402A Sprain of unspecified ligament of left ankle, initial encounter: Secondary | ICD-10-CM

## 2021-05-23 DIAGNOSIS — M19072 Primary osteoarthritis, left ankle and foot: Secondary | ICD-10-CM

## 2021-05-23 DIAGNOSIS — M778 Other enthesopathies, not elsewhere classified: Secondary | ICD-10-CM | POA: Diagnosis not present

## 2021-05-23 MED ORDER — METHYLPREDNISOLONE 4 MG PO TBPK: ORAL_TABLET | ORAL | 0 refills | Status: AC

## 2021-05-23 NOTE — Patient Instructions (Addendum)
Walking Boot, Adult  A walking boot holds your foot or ankle in place after an injury or a medical procedure. This helps with healing and prevents further injury. It has a hard, rigid outer frame that limits movement and supports your foot and leg. The inner lining is a layer of padded material. Walking boots also have adjustablestraps to secure them over the foot and leg. A walking boot may be prescribed if you can put weight (bear weight) on your injured foot. How much you can walk while wearing the boot willdepend on the type and severity of your injury. How to put on a walking boot There are different types of walking boots. Each type has specific instructions about how to wear it properly. Follow instructions from your health care provider, such as: Ask someone to help you put on the boot, if needed. Sit to put on your boot. Doing this is more comfortable and helps to prevent falls. Open up the boot fully. Place your foot into the boot so your heel rests against the back. Your toes should be supported by the base of the boot. They should not hang over the front edge. Adjust the straps so the boot fits securely but is not too tight. Do not bend the hard frame of the boot to get a good fit. How to walk with a walking boot How much you can walk will depend on your injury. Some tips for managing with a boot include: Do not try to walk without wearing the boot unless your health care provider approves. Use other assistive walking devices, including crutches or canes, as told by your health care provider. On your uninjured foot, wear a shoe with a heel that is close to the height of the walking boot. Be careful when walking on surfaces that are uneven or wet. How to reduce swelling while using a walking boot  Rest your injured foot or leg as much as possible. If directed, put ice on the injured area. To do this: Put ice in a plastic bag. Place a towel between your skin and the bag. Leave the  ice on for 20 minutes, 2-3 times a day. Remove the ice if your skin turns bright red. This is very important. If you cannot feel pain, heat, or cold, you have a greater risk of damage to the area. Keep your injured foot or leg raised (elevated) above the level of your heart for at least 2?3 hours each day or as told by your health care provider. If swelling gets worse, loosen the boot. Rest and elevate your foot and leg. How to care for your skin and foot while using a walking boot Wear a long sock to protect your foot and leg from rubbing inside the boot. Take off the boot one time each day to check the injured area. Check your foot, the surrounding skin, and your leg to make sure there are no sores, rashes, swelling, or wounds. The skin should be a healthy color, not pale or blue. Try to notice if your walking pattern (gait) in the boot is fairly normal and that you are walking without a noticeable limp. Follow instructions from your health care provider about taking care of your incision or wound, if this applies. Clean and wash the injured area as told by your health care provider. Gently dry your foot and leg before putting the boot back on. Removing your walking boot Follow directions from your health care provider for removing the walking boot.   Generally, it is okay to remove your walking boot: When you are resting or sleeping. To clean your foot and leg. How to keep the walking boot clean Do not put any part of the boot in a washing machine or dryer. Do not use chemical cleaning products. These could irritate your skin, especially if you have a wound or an incision. Do not soak the liner of the boot. Use a washcloth with mild soap and water to clean the frame and the liner of the boot by hand. Allow the boot to air-dry completely before you put it back on your foot. Follow these instructions at home: Activity Your activity will be restricted depending on the type and severity of your  injury. Follow instructions from your health care provider. Also: Bathe and shower as told by your health care provider. Do not do any activities that could make your injury worse. Do not drive if your affected foot is the one that you use for driving. Contact a health care provider if: The boot is cracked or damaged. The boot does not fit properly. Your foot or leg hurts. You have a rash, sore, or open sore (ulcer) on your foot or leg. The skin on your foot or leg is pale. You have a wound or incision on the foot and it is getting worse. Your skin becomes painful, red, or irritated. Your swelling does not get better or it gets worse. Get help right away if: You have numbness in your foot or leg. The skin on your foot or leg is cold, blue, or gray. Summary A walking boot holds your foot or ankle in place after an injury or a medical procedure. There are different types of walking boots. Follow instructions about how to correctly wear your boot. Ask someone to help you put on the boot, if needed. It is important to check your skin and foot every day. Call your health care provider if you notice a rash or sore on your foot or leg. This information is not intended to replace advice given to you by your health care provider. Make sure you discuss any questions you have with your healthcare provider. Document Revised: 07/25/2020 Document Reviewed: 07/25/2020 Elsevier Patient Education  2022 [employer]. Ankle Sprain, Phase I Rehab An ankle sprain is an injury to the ligaments of your ankle. Ankle sprains cause stiffness, loss of motion, and loss of strength. Ask your health care provider which exercises are safe for you. Do exercises exactly as told by your health care provider and adjust them as directed. It is normal to feel mild stretching, pulling, tightness, or discomfort as you do these exercises. Stop right away if you feel sudden pain or your pain gets worse. Do not begin these  exercises until told by your health care provider. Stretching and range-of-motion exercises These exercises warm up your muscles and joints and improve the movement and flexibility of your lower leg and ankle. These exercises also help to relievepain and stiffness. Gastroc and soleus stretch This exercise is also called a calf stretch. It stretches the muscles in the back of the lower leg. These muscles are the gastrocnemius, or gastroc, and the soleus. Sit on the floor with your left / right leg extended. Loop a belt or towel around the ball of your left / right foot. The ball of your foot is on the walking surface, right under your toes. Keep your left / right ankle and foot relaxed and keep your knee straight while  you use the belt or towel to pull your foot toward you. You should feel a gentle stretch behind your calf or knee in your gastroc muscle. Hold this position for __________ seconds, then release to the starting position. Repeat the exercise with your knee bent. You can put a pillow or a rolled bath towel under your knee to support it. You should feel a stretch deep in your calf in the soleus muscle or at your Achilles tendon. Repeat __________ times. Complete this exercise __________ times a day. Ankle alphabet  Sit with your left / right leg supported at the lower leg. Do not rest your foot on anything. Make sure your foot has room to move freely. Think of your left / right foot as a paintbrush. Move your foot to trace each letter of the alphabet in the air. Keep your hip and knee still while you trace. Make the letters as large as you can without feeling discomfort. Trace every letter from A to Z. Repeat __________ times. Complete this exercise __________ times a day. Strengthening exercises These exercises build strength and endurance in your ankle and lower leg. Endurance is the ability to use your muscles for a long time, even after theyget tired. Ankle dorsiflexion  Secure  a rubber exercise band or tube to an object, such as a table leg, that will stay still when the band is pulled. Secure the other end around your left / right foot. Sit on the floor facing the object, with your left / right leg extended. The band or tube should be slightly tense when your foot is relaxed. Slowly bring your foot toward you, bringing the top of your foot toward your shin (dorsiflexion), and pulling the band tighter. Hold this position for __________ seconds. Slowly return your foot to the starting position. Repeat __________ times. Complete this exercise __________ times a day. Ankle plantar flexion  Sit on the floor with your left / right leg extended. Loop a rubber exercise tube or band around the ball of your left / right foot. The ball of your foot is on the walking surface, right under your toes. Hold the ends of the band or tube in your hands. The band or tube should be slightly tense when your foot is relaxed. Slowly point your foot and toes downward to tilt the top of your foot away from your shin (plantar flexion). Hold this position for __________ seconds. Slowly return your foot to the starting position. Repeat __________ times. Complete this exercise __________ times a day. Ankle eversion Sit on the floor with your legs straight out in front of you. Loop a rubber exercise band or tube around the ball of your left / right foot. The ball of your foot is on the walking surface, right under your toes. Hold the ends of the band in your hands, or secure the band to a stable object. The band or tube should be slightly tense when your foot is relaxed. Slowly push your foot outward, away from your other leg (eversion). Hold this position for __________ seconds. Slowly return your foot to the starting position. Repeat __________ times. Complete this exercise __________ times a day. This information is not intended to replace advice given to you by your health care provider.  Make sure you discuss any questions you have with your healthcare provider. Document Revised: 01/20/2019 Document Reviewed: 07/14/2018 Elsevier Patient Education  2022 [employer].

## 2021-05-26 NOTE — Progress Notes (Signed)
Subjective: 75 year old male presents the office with concerns of a sprain to his left ankle that occurred on Friday.  He states that he was walking in class and twisted his ankle.  He states he does not have significant pain to his ankle once he turns a certain way.  His main concern still gets pain in the top of his foot.  He was last seen by Dr. Amalia Hailey for the standard steroid injection performed which helped minimally.  Describes discomfort of the top of the foot as well as burning sensation on top of the foot. Denies any systemic complaints such as fevers, chills, nausea, vomiting. No acute changes since last appointment, and no other complaints at this time.   Objective: AAO x3, NAD DP/PT pulses palpable bilaterally, CRT less than 3 seconds On today's exam there is no significant tenderness palpation to the tibia, fibula or proximal tib-fib.  There is slight discomfort along the ATFL.  There is no gross ankle instability present.  Majority discomfort still localized on the dorsal aspect of the left midtarsal joint.  Trace edema.  There is no erythema or warmth.  Flexor and extensor tendons appear to be clinically intact.  MMT 5/5. No open lesions or pre-ulcerative lesions.  No pain with calf compression, swelling, warmth, erythema  Assessment: Left ankle sprain, left chronic midfoot pain  Plan: -All treatment options discussed with the patient including all alternatives, risks, complications.  -X-rays obtained reviewed ankle.  No evidence of acute fracture. -At this point given his symptoms recommended immobilization in a cam boot which was dispensed.  Prescribed Medrol Dosepak.  Ice daily.  He starts to feel better discussed rehab exercises for ankle sprains.  Also given ongoing nature of his left foot pain and ordering MRI. -Patient encouraged to call the office with any questions, concerns, change in symptoms.   Trula Slade DPM

## 2021-05-29 ENCOUNTER — Telehealth: Payer: Self-pay | Admitting: *Deleted

## 2021-05-29 NOTE — Telephone Encounter (Signed)
Patient has an appointment on 06-01-2021 at [facility] imaging. Charles Reed

## 2021-05-29 NOTE — Telephone Encounter (Signed)
-----   Message from Trula Slade, DPM sent at 05/26/2021  7:54 AM EDT ----- I ordered a MRI on Tuesday. Can you please follow up on this? Thanks!

## 2021-06-01 ENCOUNTER — Ambulatory Visit
Admission: RE | Admit: 2021-06-01 | Discharge: 2021-06-01 | Disposition: A | Discharge status: Home / Self Care | Payer: Medicare HMO | Source: Ambulatory Visit | Attending: Podiatry | Admitting: Podiatry

## 2021-06-01 DIAGNOSIS — M19072 Primary osteoarthritis, left ankle and foot: Secondary | ICD-10-CM | POA: Diagnosis not present

## 2021-06-01 DIAGNOSIS — S86312A Strain of muscle(s) and tendon(s) of peroneal muscle group at lower leg level, left leg, initial encounter: Secondary | ICD-10-CM | POA: Diagnosis not present

## 2021-06-05 ENCOUNTER — Telehealth: Payer: Self-pay | Admitting: Podiatry

## 2021-06-05 NOTE — Telephone Encounter (Signed)
Patient stated that Dr. Jacqualyn Posey called him Friday. Patient called to follow up

## 2021-06-15 DIAGNOSIS — H2513 Age-related nuclear cataract, bilateral: Secondary | ICD-10-CM | POA: Diagnosis not present

## 2021-06-15 DIAGNOSIS — H16223 Keratoconjunctivitis sicca, not specified as Sjogren's, bilateral: Secondary | ICD-10-CM | POA: Diagnosis not present

## 2021-07-19 DIAGNOSIS — Z23 Encounter for immunization: Secondary | ICD-10-CM | POA: Diagnosis not present

## 2021-08-08 DIAGNOSIS — H25043 Posterior subcapsular polar age-related cataract, bilateral: Secondary | ICD-10-CM | POA: Diagnosis not present

## 2021-08-08 DIAGNOSIS — H2513 Age-related nuclear cataract, bilateral: Secondary | ICD-10-CM | POA: Diagnosis not present

## 2021-08-08 DIAGNOSIS — H18413 Arcus senilis, bilateral: Secondary | ICD-10-CM | POA: Diagnosis not present

## 2021-08-08 DIAGNOSIS — H25013 Cortical age-related cataract, bilateral: Secondary | ICD-10-CM | POA: Diagnosis not present

## 2021-09-28 DIAGNOSIS — H2513 Age-related nuclear cataract, bilateral: Secondary | ICD-10-CM | POA: Diagnosis not present

## 2021-09-29 DIAGNOSIS — H25041 Posterior subcapsular polar age-related cataract, right eye: Secondary | ICD-10-CM | POA: Diagnosis not present

## 2021-09-29 DIAGNOSIS — H2511 Age-related nuclear cataract, right eye: Secondary | ICD-10-CM | POA: Diagnosis not present

## 2021-09-29 DIAGNOSIS — H2512 Age-related nuclear cataract, left eye: Secondary | ICD-10-CM | POA: Diagnosis not present

## 2021-09-29 DIAGNOSIS — H25011 Cortical age-related cataract, right eye: Secondary | ICD-10-CM | POA: Diagnosis not present

## 2021-10-20 DIAGNOSIS — H2511 Age-related nuclear cataract, right eye: Secondary | ICD-10-CM | POA: Diagnosis not present

## 2021-10-20 DIAGNOSIS — H2513 Age-related nuclear cataract, bilateral: Secondary | ICD-10-CM | POA: Diagnosis not present

## 2021-12-20 DIAGNOSIS — H1045 Other chronic allergic conjunctivitis: Secondary | ICD-10-CM | POA: Diagnosis not present

## 2021-12-20 DIAGNOSIS — H532 Diplopia: Secondary | ICD-10-CM | POA: Diagnosis not present

## 2021-12-21 DIAGNOSIS — H524 Presbyopia: Secondary | ICD-10-CM | POA: Diagnosis not present

## 2022-02-12 DIAGNOSIS — D696 Thrombocytopenia, unspecified: Secondary | ICD-10-CM | POA: Diagnosis not present

## 2022-02-12 DIAGNOSIS — M5136 Other intervertebral disc degeneration, lumbar region: Secondary | ICD-10-CM | POA: Diagnosis not present

## 2022-02-12 DIAGNOSIS — D72819 Decreased white blood cell count, unspecified: Secondary | ICD-10-CM | POA: Diagnosis not present

## 2022-02-12 DIAGNOSIS — Z125 Encounter for screening for malignant neoplasm of prostate: Secondary | ICD-10-CM | POA: Diagnosis not present

## 2022-02-12 DIAGNOSIS — E7800 Pure hypercholesterolemia, unspecified: Secondary | ICD-10-CM | POA: Diagnosis not present

## 2022-02-12 DIAGNOSIS — Z0000 Encounter for general adult medical examination without abnormal findings: Secondary | ICD-10-CM | POA: Diagnosis not present

## 2022-02-12 DIAGNOSIS — N400 Enlarged prostate without lower urinary tract symptoms: Secondary | ICD-10-CM | POA: Diagnosis not present

## 2022-02-12 DIAGNOSIS — Z1331 Encounter for screening for depression: Secondary | ICD-10-CM | POA: Diagnosis not present

## 2022-02-12 DIAGNOSIS — J309 Allergic rhinitis, unspecified: Secondary | ICD-10-CM | POA: Diagnosis not present

## 2022-02-12 DIAGNOSIS — I10 Essential (primary) hypertension: Secondary | ICD-10-CM | POA: Diagnosis not present

## 2022-02-12 DIAGNOSIS — R7303 Prediabetes: Secondary | ICD-10-CM | POA: Diagnosis not present

## 2022-07-12 DIAGNOSIS — H43813 Vitreous degeneration, bilateral: Secondary | ICD-10-CM | POA: Diagnosis not present

## 2022-07-12 DIAGNOSIS — H16223 Keratoconjunctivitis sicca, not specified as Sjogren's, bilateral: Secondary | ICD-10-CM | POA: Diagnosis not present

## 2022-07-12 DIAGNOSIS — H26492 Other secondary cataract, left eye: Secondary | ICD-10-CM | POA: Diagnosis not present

## 2022-07-28 DIAGNOSIS — Z23 Encounter for immunization: Secondary | ICD-10-CM | POA: Diagnosis not present

## 2022-08-13 DIAGNOSIS — N400 Enlarged prostate without lower urinary tract symptoms: Secondary | ICD-10-CM | POA: Diagnosis not present

## 2022-08-13 DIAGNOSIS — J309 Allergic rhinitis, unspecified: Secondary | ICD-10-CM | POA: Diagnosis not present

## 2022-08-13 DIAGNOSIS — M5136 Other intervertebral disc degeneration, lumbar region: Secondary | ICD-10-CM | POA: Diagnosis not present

## 2022-08-13 DIAGNOSIS — D696 Thrombocytopenia, unspecified: Secondary | ICD-10-CM | POA: Diagnosis not present

## 2022-08-13 DIAGNOSIS — D72819 Decreased white blood cell count, unspecified: Secondary | ICD-10-CM | POA: Diagnosis not present

## 2022-08-13 DIAGNOSIS — R7303 Prediabetes: Secondary | ICD-10-CM | POA: Diagnosis not present

## 2022-08-13 DIAGNOSIS — E7800 Pure hypercholesterolemia, unspecified: Secondary | ICD-10-CM | POA: Diagnosis not present

## 2022-08-13 DIAGNOSIS — I10 Essential (primary) hypertension: Secondary | ICD-10-CM | POA: Diagnosis not present

## 2022-12-27 DIAGNOSIS — H16223 Keratoconjunctivitis sicca, not specified as Sjogren's, bilateral: Secondary | ICD-10-CM | POA: Diagnosis not present

## 2022-12-27 DIAGNOSIS — H43813 Vitreous degeneration, bilateral: Secondary | ICD-10-CM | POA: Diagnosis not present

## 2022-12-27 DIAGNOSIS — H26492 Other secondary cataract, left eye: Secondary | ICD-10-CM | POA: Diagnosis not present

## 2022-12-30 IMAGING — MR MR FOOT*L* W/O CM
5 series · 39 of 40 positions shown · non-contrast
Comparison: X-ray 05/23/2021

CLINICAL DATA: Foot pain, chronic, inflammatory arthropathy
suspected, xray done. Twisting injury 2 weeks ago.

EXAM:
MRI OF THE LEFT FOOT WITHOUT CONTRAST
TECHNIQUE: Multiplanar, multisequence MR imaging of the ankle was performed. No
intravenous contrast was administered.

[Series 5: T2 fat-sat · axial · 3.0mm · 0.56mm/px · z∈[-36,+91]mm · 9 of 34 slices shown (1 of 2)]
[im 1/34]
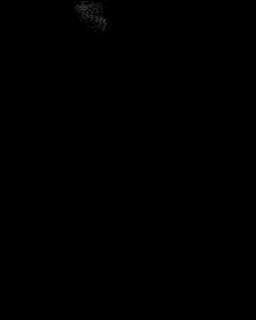
[im 5/34]
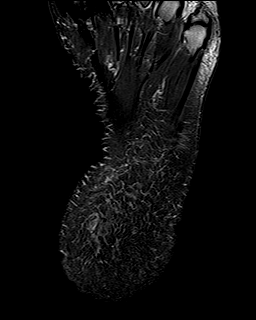
[im 9/34]
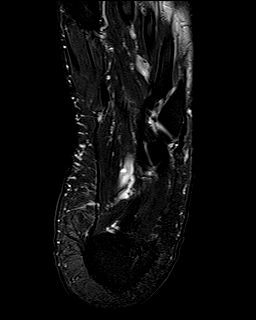
[im 13/34]
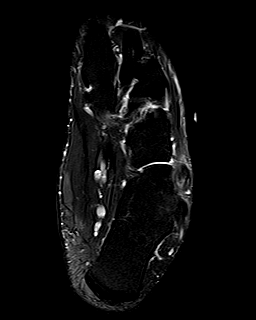
[im 17/34]
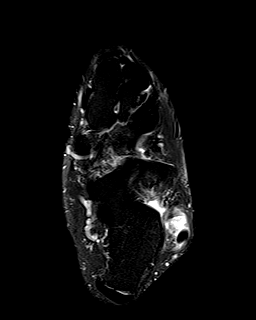
[im 21/34]
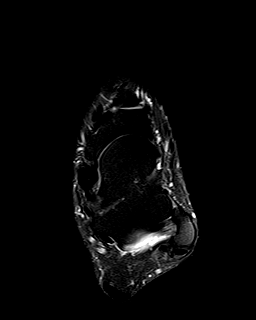
[im 25/34]
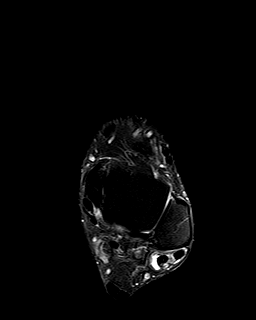
[im 29/34]
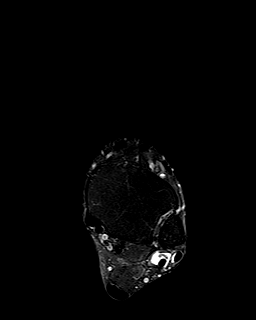
[im 34/34]
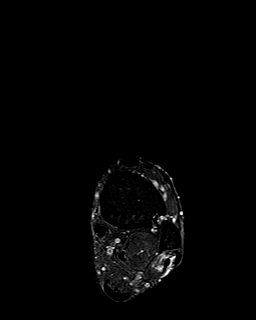

[Series 6: PD fat-sat · axial · 3.0mm · 0.47mm/px · z∈[-36,+91]mm · 8 of 34 slices shown]
[im 1/34]
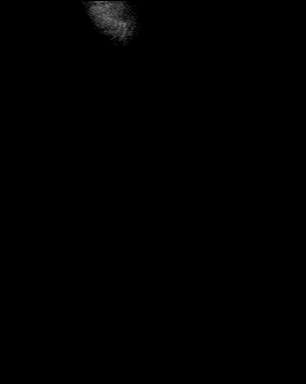
[im 5/34]
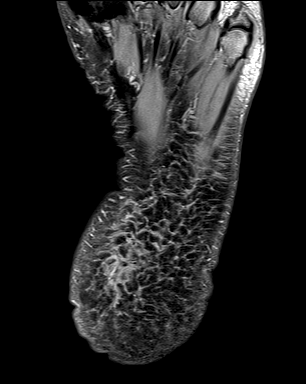
[im 10/34]
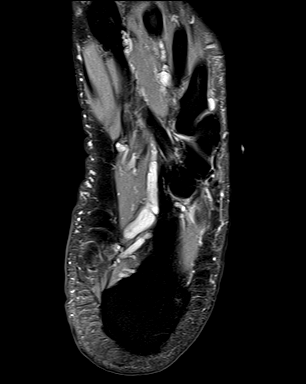
[im 15/34]
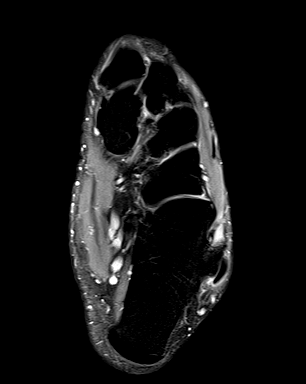
[im 19/34]
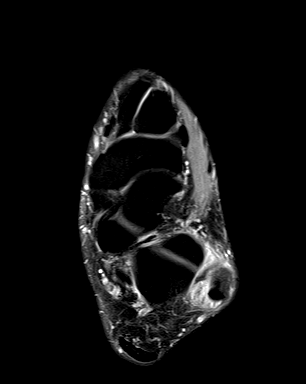
[im 24/34]
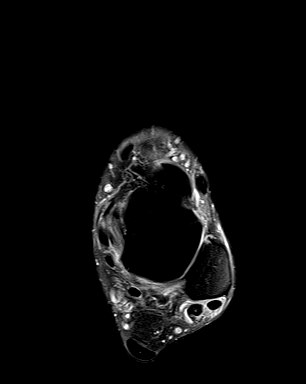
[im 29/34]
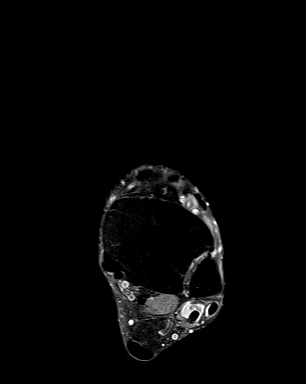
[im 34/34]
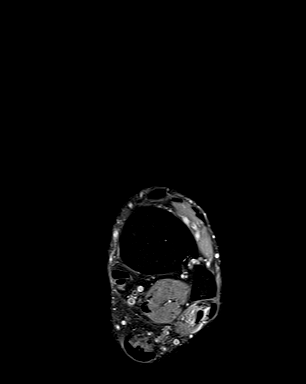

[Series 7: T1 · sagittal · 4.0mm · 0.59mm/px · 6 of 24 slices shown]
[im 1/24]
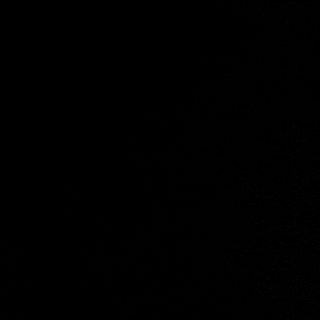
[im 5/24]
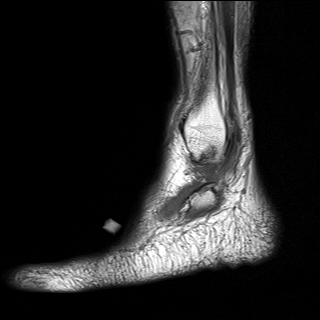
[im 10/24]
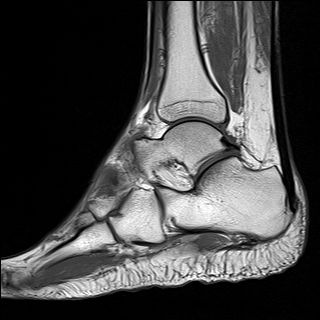
[im 14/24]
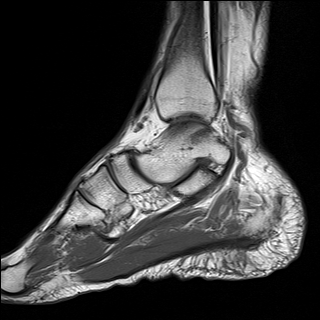
[im 19/24]
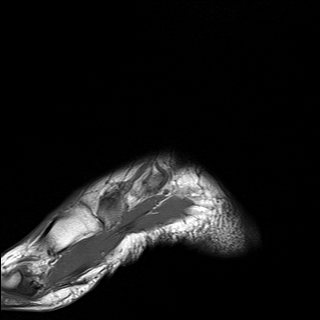
[im 24/24]
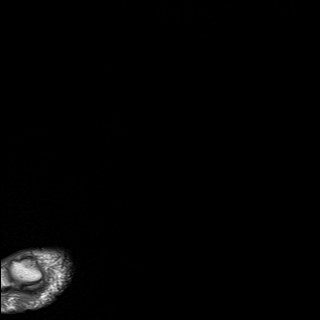

[Series 8: STIR · sagittal · 4.0mm · 0.35mm/px · 5 of 24 slices shown]
[im 1/24]
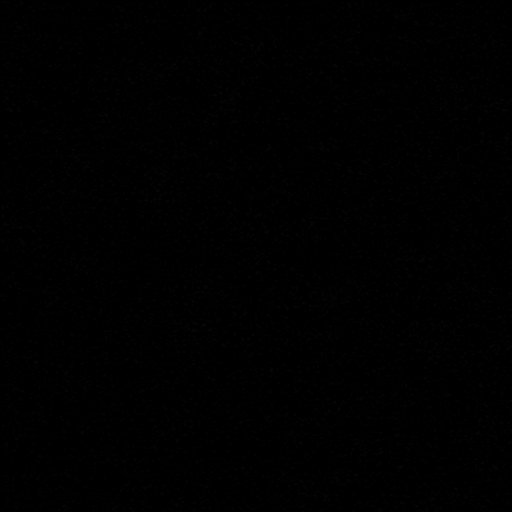
[im 5/24]
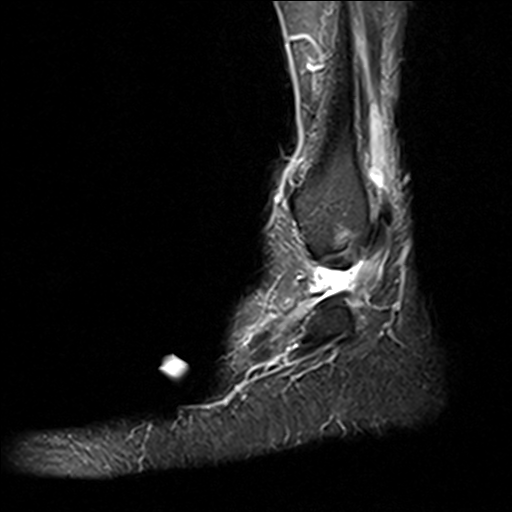
[im 10/24]
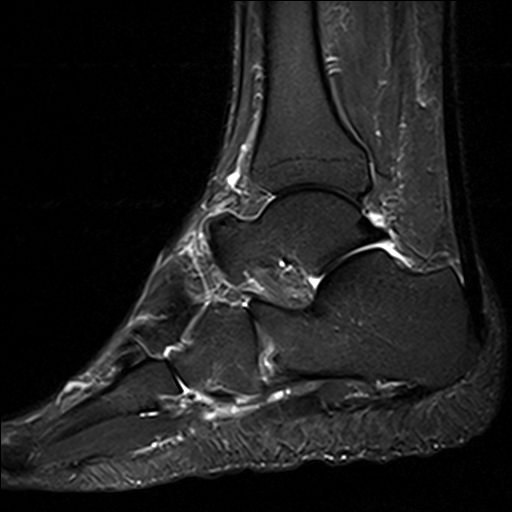
[im 14/24]
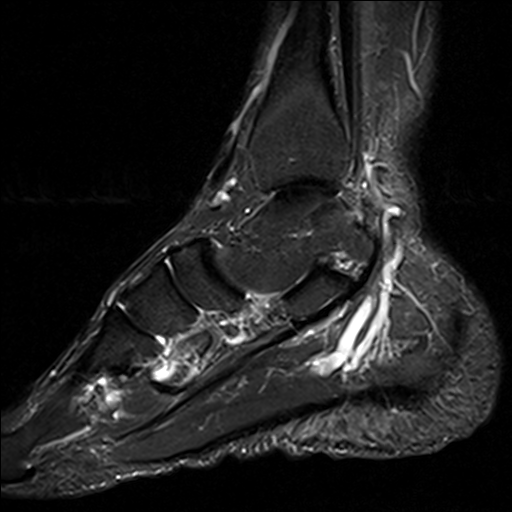
[im 19/24]
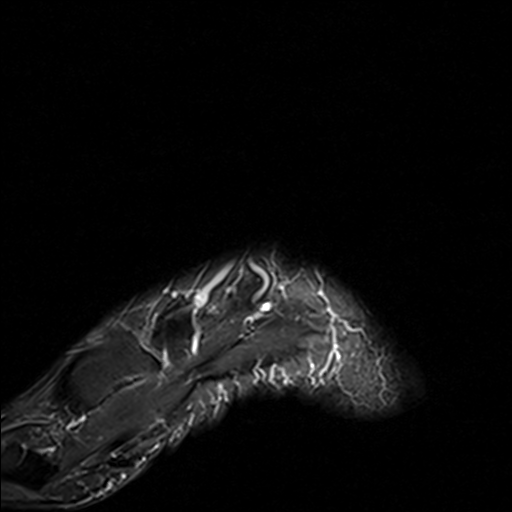

[Series 9: T2 fat-sat · coronal · 3.0mm · 0.50mm/px · 11 of 44 slices shown (2 of 2)]
[im 1/44]
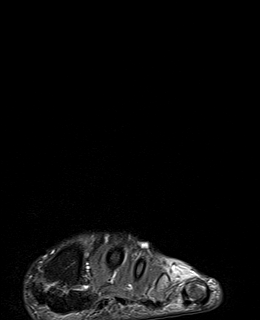
[im 5/44]
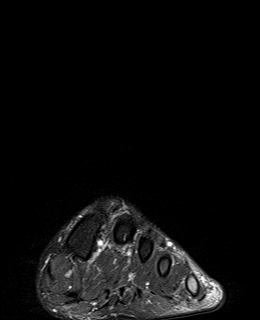
[im 9/44]
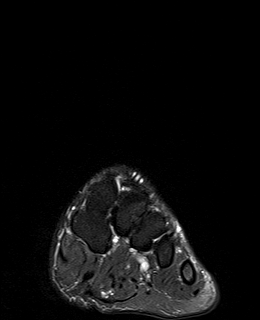
[im 13/44]
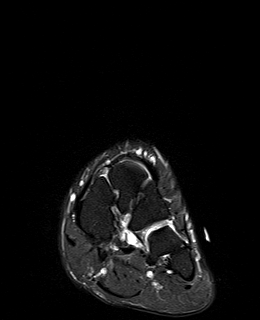
[im 18/44]
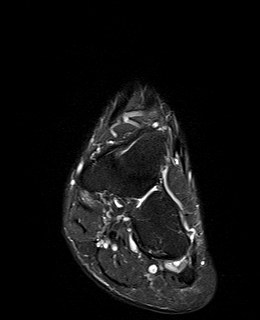
[im 22/44]
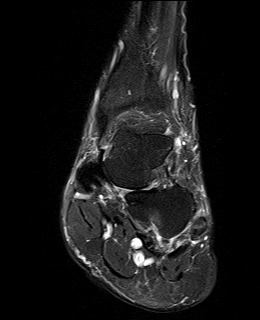
[im 26/44]
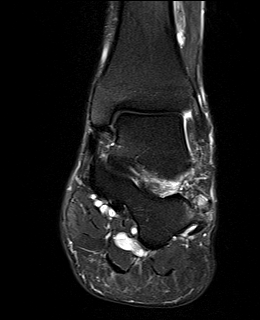
[im 31/44]
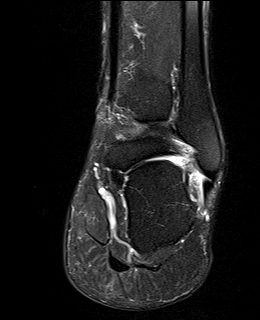
[im 35/44]
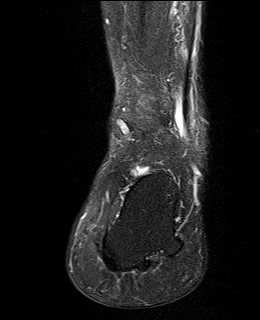
[im 39/44]
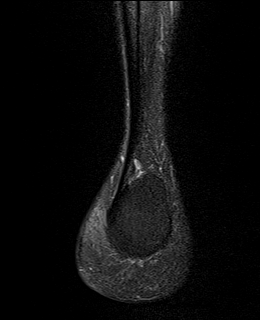
[im 44/44]
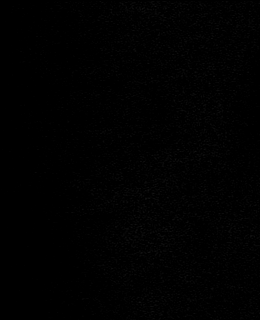

[39 of 40 positions shown; findings below may reference images not displayed]

FINDINGS: TENDONS

Peroneal: Complete tear of the peroneus brevis tendon at the level
of the lateral malleolus with an approximately 2.5 cm tendon gap
(series 6, images 15-21). The torn ends of the tendon are markedly
thickened and heterogeneous compatible with underlying tendinosis.
Mild peroneus brevis tendinosis without tear. Moderate tenosynovial
fluid within the peroneal tendon sheaths.

Posteromedial: Intact tibialis posterior, flexor hallucis longus and
flexor digitorum longus tendons.

Anterior: Intact tibialis anterior, extensor hallucis longus and
extensor digitorum longus tendons.

Achilles: Intact.

Plantar Fascia: Thickening of the central band of the plantar fascia
adjacent to the calcaneal enthesis. No tear or perifascial edema.

LIGAMENTS

Lateral: Anterior talofibular ligament is attenuated but remains
intact. The posterior tibiofibular ligament is intact. The anterior
and posterior talofibular ligaments are intact. Intact
calcaneofibular ligament.

Medial: Deltoid ligament and spring ligament complex intact.

CARTILAGE

Ankle Joint: No joint effusion or chondral defect.

Subtalar Joints/Sinus Tarsi: No joint effusion or chondral defect.
Preservation of the anatomic fat within the sinus tarsi.

Bones: No acute fracture or dislocation. No bone marrow edema. Mild
midfoot osteoarthritis.

Other: No fluid collections within the soft tissues.
IMPRESSION: 1. Complete tear of the peroneus brevis tendon at the level of the
lateral malleolus with an approximately 2.5 cm tendon gap. The torn
ends of the tendon are markedly thickened and heterogeneous
compatible with underlying tendinosis.
2. Mild peroneus brevis tendinosis without tear.
3. Mild osteoarthritis of the left midfoot.

## 2023-02-14 DIAGNOSIS — J309 Allergic rhinitis, unspecified: Secondary | ICD-10-CM | POA: Diagnosis not present

## 2023-02-14 DIAGNOSIS — E7800 Pure hypercholesterolemia, unspecified: Secondary | ICD-10-CM | POA: Diagnosis not present

## 2023-02-14 DIAGNOSIS — Z1389 Encounter for screening for other disorder: Secondary | ICD-10-CM | POA: Diagnosis not present

## 2023-02-14 DIAGNOSIS — I10 Essential (primary) hypertension: Secondary | ICD-10-CM | POA: Diagnosis not present

## 2023-02-14 DIAGNOSIS — M5136 Other intervertebral disc degeneration, lumbar region: Secondary | ICD-10-CM | POA: Diagnosis not present

## 2023-02-14 DIAGNOSIS — N529 Male erectile dysfunction, unspecified: Secondary | ICD-10-CM | POA: Diagnosis not present

## 2023-02-14 DIAGNOSIS — Z0000 Encounter for general adult medical examination without abnormal findings: Secondary | ICD-10-CM | POA: Diagnosis not present

## 2023-02-14 DIAGNOSIS — D696 Thrombocytopenia, unspecified: Secondary | ICD-10-CM | POA: Diagnosis not present

## 2023-02-14 DIAGNOSIS — R7303 Prediabetes: Secondary | ICD-10-CM | POA: Diagnosis not present

## 2023-03-01 DIAGNOSIS — Z6828 Body mass index (BMI) 28.0-28.9, adult: Secondary | ICD-10-CM | POA: Diagnosis not present

## 2023-03-01 DIAGNOSIS — M47816 Spondylosis without myelopathy or radiculopathy, lumbar region: Secondary | ICD-10-CM | POA: Diagnosis not present

## 2023-03-21 DIAGNOSIS — M7742 Metatarsalgia, left foot: Secondary | ICD-10-CM | POA: Diagnosis not present

## 2023-03-21 DIAGNOSIS — I10 Essential (primary) hypertension: Secondary | ICD-10-CM | POA: Diagnosis not present

## 2023-03-21 DIAGNOSIS — D696 Thrombocytopenia, unspecified: Secondary | ICD-10-CM | POA: Diagnosis not present

## 2023-03-21 DIAGNOSIS — M5136 Other intervertebral disc degeneration, lumbar region: Secondary | ICD-10-CM | POA: Diagnosis not present

## 2023-03-21 DIAGNOSIS — E7800 Pure hypercholesterolemia, unspecified: Secondary | ICD-10-CM | POA: Diagnosis not present

## 2023-05-14 DIAGNOSIS — H8110 Benign paroxysmal vertigo, unspecified ear: Secondary | ICD-10-CM | POA: Diagnosis not present

## 2023-07-15 DIAGNOSIS — H43813 Vitreous degeneration, bilateral: Secondary | ICD-10-CM | POA: Diagnosis not present

## 2023-07-15 DIAGNOSIS — H16223 Keratoconjunctivitis sicca, not specified as Sjogren's, bilateral: Secondary | ICD-10-CM | POA: Diagnosis not present

## 2023-07-15 DIAGNOSIS — H35361 Drusen (degenerative) of macula, right eye: Secondary | ICD-10-CM | POA: Diagnosis not present

## 2023-10-07 DIAGNOSIS — M792 Neuralgia and neuritis, unspecified: Secondary | ICD-10-CM | POA: Diagnosis not present

## 2023-11-04 DIAGNOSIS — H1131 Conjunctival hemorrhage, right eye: Secondary | ICD-10-CM | POA: Diagnosis not present

## 2024-01-01 DIAGNOSIS — R079 Chest pain, unspecified: Secondary | ICD-10-CM | POA: Diagnosis not present

## 2024-01-01 DIAGNOSIS — I10 Essential (primary) hypertension: Secondary | ICD-10-CM | POA: Diagnosis not present

## 2024-03-24 ENCOUNTER — Ambulatory Visit: Attending: Cardiology | Admitting: Cardiology

## 2024-03-24 ENCOUNTER — Encounter: Payer: Self-pay | Admitting: Cardiology

## 2024-03-24 VITALS — BP 132/58 | HR 67 | Ht 69.0 in | Wt 195.0 lb

## 2024-03-24 DIAGNOSIS — R072 Precordial pain: Secondary | ICD-10-CM | POA: Diagnosis not present

## 2024-03-24 DIAGNOSIS — E782 Mixed hyperlipidemia: Secondary | ICD-10-CM | POA: Diagnosis not present

## 2024-03-24 DIAGNOSIS — I10 Essential (primary) hypertension: Secondary | ICD-10-CM | POA: Diagnosis not present

## 2024-03-24 NOTE — Patient Instructions (Signed)
 Testing/Procedures: Exercise nuclear stress test   Your physician has requested that you have en exercise stress myoview. For further information please visit [email or web address] Please follow instruction sheet, as given.   Calcium score   CT scanning for a cardiac calcium score (CAT scanning), is a noninvasive, special x-ray that produces cross-sectional images of the body using x-rays and a computer. CT scans help physicians diagnose and treat medical conditions. For some CT exams, a contrast material is used to enhance visibility in the area of the body being studied. CT scans provide greater clarity and reveal more details than regular x-ray exams.   Follow-Up: At [facility], you and your health needs are our priority.  As part of our continuing mission to provide you with exceptional heart care, our providers are all part of one team.  This team includes your primary Cardiologist (physician) and Advanced Practice Providers or APPs (Physician Assistants and Nurse Practitioners) who all work together to provide you with the care you need, when you need it.  Your next appointment:   As needed   Provider:   Cody Das, MD

## 2024-03-24 NOTE — Progress Notes (Signed)
 Cardiology Office Note:  .   Date:  03/24/2024  ID:  [name], DOB 22-Feb-1946, MRN [id number] PCP: Jearldine Mina, MD  [facility] Providers Cardiologist:  Fransico Ivy, MD PCP: Jearldine Mina, MD  Chief Complaint  Patient presents with   Chest Pain   Establish Care   Hypertension     Charles Reed is a 78 y.o. male with hypertension, hyperlipidemia, prediabetes, chest pain    Discussed the use of AI scribe software for clinical note transcription with the patient, who gave verbal consent to proceed.  History of Present Illness Charles Reed is a 78 year old male who presents with chest pain.  He experienced chest pain a few weeks to months ago, described as a 'sharp pain' that initially occurred in one area and then moved across his chest. He has not experienced this pain since making the appointment two months ago and does not have chest pain during physical activities such as bowling.  He is on amlodipine-valsartan for blood pressure control, doing fairly well.  His family history includes high blood pressure in his mother, grandmother, cousin, and aunt. He is physically active, engaging in activities such as bowling. He quit smoking in 1986.  He is a former smoker, quit in 1986.    Vitals:   03/24/24 0826  BP: (!) 132/58  Pulse: 67  SpO2: 97%      Review of Systems  Cardiovascular:  Positive for chest pain. Negative for dyspnea on exertion, leg swelling, palpitations and syncope.        Studies Reviewed: Aaron Aas        EKG 03/24/2024: Normal sinus rhythm Left anterior fascicular block Minimal voltage criteria for LVH, may be normal variant ( R in aVL ) Nonspecific T wave abnormality When compared with ECG of 08-Jul-2017 12:58, T wave inversion more evident in Inferior leads T wave inversion now evident in Lateral leads  Independently interpreted 5-09/2024: LDL 119 A1C 6.1%   Physical Exam Vitals and nursing note reviewed.   Constitutional:      General: He is not in acute distress. Neck:     Vascular: No JVD.  Cardiovascular:     Rate and Rhythm: Normal rate and regular rhythm.     Heart sounds: Normal heart sounds. No murmur heard. Pulmonary:     Effort: Pulmonary effort is normal.     Breath sounds: Normal breath sounds. No wheezing or rales.  Musculoskeletal:     Right lower leg: No edema.     Left lower leg: No edema.      VISIT DIAGNOSES:   ICD-10-CM   1. Precordial pain  R07.2 EKG 12-Lead       [name] is a 78 y.o. male with hypertension, hyperlipidemia, prediabetes, chest pain Assessment & Plan Chest pain: Other atypical chest pain.  Risk factors include hypertension, prediabetes, hyperlipidemia.  Recommend exercise nuclear stress test and calcium score scan for risk stratification. Recommend heart healthy diet and lifestyle, with regular walking up to 10,000 steps a day, Mediterranean style diet.  Hypertension: Continue amlodipine-valsartan.     Informed Consent   Shared Decision Making/Informed Consent The risks [chest pain, shortness of breath, cardiac arrhythmias, dizziness, blood pressure fluctuations, myocardial infarction, stroke/transient ischemic attack, nausea, vomiting, allergic reaction, radiation exposure, metallic taste sensation and life-threatening complications (estimated to be 1 in 10,000)], benefits (risk stratification, diagnosing coronary artery disease, treatment guidance) and alternatives of a nuclear stress test were discussed in detail with Mr. Maimone  and he agrees to proceed.       No orders of the defined types were placed in this encounter.    F/u as needed  Signed, Cody Das, MD

## 2024-03-25 ENCOUNTER — Other Ambulatory Visit: Payer: Self-pay | Admitting: Cardiology

## 2024-03-25 DIAGNOSIS — R072 Precordial pain: Secondary | ICD-10-CM

## 2024-03-26 ENCOUNTER — Telehealth (HOSPITAL_COMMUNITY): Payer: Self-pay | Admitting: *Deleted

## 2024-03-26 NOTE — Telephone Encounter (Signed)
Pt given detailed instructions for MPI study.

## 2024-04-02 ENCOUNTER — Ambulatory Visit (HOSPITAL_COMMUNITY)
Admission: RE | Admit: 2024-04-02 | Discharge: 2024-04-02 | Disposition: A | Discharge status: Home / Self Care | Source: Ambulatory Visit | Attending: Cardiology | Admitting: Cardiology

## 2024-04-02 ENCOUNTER — Ambulatory Visit: Payer: Self-pay | Admitting: Cardiology

## 2024-04-02 DIAGNOSIS — R072 Precordial pain: Secondary | ICD-10-CM

## 2024-04-02 LAB — MYOCARDIAL PERFUSION IMAGING
Estimated workload: 5.8
Exercise duration (min): 4 min
Exercise duration (sec): 4 s
LV dias vol: 111 mL (ref 62–150)
LV sys vol: 48 mL (ref 4.2–5.8)
MPHR: 141 {beats}/min
Nuc Stress EF: 57 %
Peak HR: 129 {beats}/min
Percent HR: 91 %
Rest HR: 71 {beats}/min
Rest Nuclear Isotope Dose: 10.8 mCi
SDS: 0
SRS: 6
SSS: 2
ST Depression (mm): 0 mm
Stress Nuclear Isotope Dose: 31.6 mCi
TID: 0.97

## 2024-04-02 MED ORDER — TECHNETIUM TC 99M TETROFOSMIN IV KIT
10.8000 | PACK | Freq: Once | INTRAVENOUS | Status: AC | PRN
  Administered 2024-04-02: 10.8 via INTRAVENOUS

## 2024-04-02 MED ORDER — TECHNETIUM TC 99M TETROFOSMIN IV KIT
31.6000 | PACK | Freq: Once | INTRAVENOUS | Status: AC | PRN
  Administered 2024-04-02: 31.6 via INTRAVENOUS

## 2024-04-02 NOTE — Progress Notes (Signed)
 No significant heart muscle circulation abnormalities noted on stress test. Mild coronary calcification noted. LDL is high. In addition to heart healthy diet and lifestyle, consider Crestor 20 mg daily. Can follow up future refills and repeat lipid panel with PCP.  Thanks MJP

## 2024-04-06 NOTE — Telephone Encounter (Signed)
 Pt requesting a c/b in regards to results.

## 2024-04-10 ENCOUNTER — Ambulatory Visit: Payer: Self-pay | Admitting: Cardiology

## 2024-04-14 ENCOUNTER — Other Ambulatory Visit (HOSPITAL_BASED_OUTPATIENT_CLINIC_OR_DEPARTMENT_OTHER)

## 2024-07-14 DIAGNOSIS — M519 Unspecified thoracic, thoracolumbar and lumbosacral intervertebral disc disorder: Secondary | ICD-10-CM | POA: Diagnosis not present

## 2024-07-14 DIAGNOSIS — D696 Thrombocytopenia, unspecified: Secondary | ICD-10-CM | POA: Diagnosis not present

## 2024-07-14 DIAGNOSIS — D72819 Decreased white blood cell count, unspecified: Secondary | ICD-10-CM | POA: Diagnosis not present

## 2024-07-14 DIAGNOSIS — E7800 Pure hypercholesterolemia, unspecified: Secondary | ICD-10-CM | POA: Diagnosis not present

## 2024-07-14 DIAGNOSIS — N400 Enlarged prostate without lower urinary tract symptoms: Secondary | ICD-10-CM | POA: Diagnosis not present

## 2024-07-14 DIAGNOSIS — R7303 Prediabetes: Secondary | ICD-10-CM | POA: Diagnosis not present

## 2024-07-14 DIAGNOSIS — I10 Essential (primary) hypertension: Secondary | ICD-10-CM | POA: Diagnosis not present

## 2024-07-14 DIAGNOSIS — Z0000 Encounter for general adult medical examination without abnormal findings: Secondary | ICD-10-CM | POA: Diagnosis not present

## 2024-07-14 DIAGNOSIS — E559 Vitamin D deficiency, unspecified: Secondary | ICD-10-CM | POA: Diagnosis not present
